# Patient Record
Sex: Female | Born: 1990 | Race: Black or African American | Hispanic: No | Marital: Single
Health system: Midwestern US, Community
[De-identification: ages and names within clinical notes are randomized; demographics above are authoritative.]

## PROBLEM LIST (undated history)

## (undated) DIAGNOSIS — J302 Other seasonal allergic rhinitis: Secondary | ICD-10-CM

## (undated) DIAGNOSIS — M25551 Pain in right hip: Principal | ICD-10-CM

## (undated) DIAGNOSIS — F419 Anxiety disorder, unspecified: Secondary | ICD-10-CM

## (undated) DIAGNOSIS — J45909 Unspecified asthma, uncomplicated: Secondary | ICD-10-CM

## (undated) DIAGNOSIS — F32A Depression, unspecified: Secondary | ICD-10-CM

## (undated) DIAGNOSIS — F329 Major depressive disorder, single episode, unspecified: Secondary | ICD-10-CM

## (undated) DIAGNOSIS — M069 Rheumatoid arthritis, unspecified: Secondary | ICD-10-CM

## (undated) HISTORY — DX: Depression, unspecified: F32.A

## (undated) HISTORY — PX: OTHER SURGICAL HISTORY: SHX169

## (undated) HISTORY — DX: Morbid (severe) obesity due to excess calories: E66.01

## (undated) HISTORY — DX: Anxiety disorder, unspecified: F41.9

## (undated) HISTORY — PX: BREAST REDUCTION SURGERY: SHX8

## (undated) HISTORY — PX: ROOT CANAL: SHX2363

## (undated) HISTORY — DX: Rheumatoid arthritis, unspecified: M06.9

## (undated) HISTORY — DX: Other seasonal allergic rhinitis: J30.2

---
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
## 2009-05-08 ENCOUNTER — Ambulatory Visit (INDEPENDENT_AMBULATORY_CARE_PROVIDER_SITE_OTHER): Payer: MEDICAID | Admitting: UHP WOMENS HEALTH

## 2009-05-08 ENCOUNTER — Other Ambulatory Visit: Payer: Self-pay

## 2009-05-08 ENCOUNTER — Encounter (INDEPENDENT_AMBULATORY_CARE_PROVIDER_SITE_OTHER): Payer: Self-pay | Admitting: UHP WOMENS HEALTH

## 2009-05-08 ENCOUNTER — Ambulatory Visit: Admit: 2009-05-08 | Payer: Self-pay | Source: Ambulatory Visit | Admitting: UHP WOMENS HEALTH

## 2009-05-08 VITALS — BP 120/80 | Wt 145.0 lb

## 2009-05-08 LAB — NEISSERIA GONORRHOEAE DNA BY PCR
SPECIMEN SITE: NEGATIVE — NL
SPECIMEN SITE: NEGATIVE — NL

## 2009-05-08 LAB — CHLAMYDIA TRACHOMITIS DNA BY PCR (INHOUSE)
CHLAMYDIA TRACHOMATIS: POSITIVE — NL
CHLAMYDIA TRACHOMATIS: POSITIVE — NL

## 2009-05-09 ENCOUNTER — Ambulatory Visit: Admission: RE | Admit: 2009-05-09 | Payer: Self-pay | Source: Ambulatory Visit | Admitting: UHP WOMENS HEALTH

## 2009-05-09 LAB — CBC
HCT: 37 — NL
HGB: 12.7 — NL
PLATELET COUNT: 322 — NL
WBC: 11.8 — NL

## 2009-05-09 LAB — CYSTIC FIBROSIS, CARRIER DETECTION: CYSTIC FIBROSIS MUTAT. PNL: NEGATIVE — NL

## 2009-05-09 LAB — HEPATITIS B SURFACE ANTIGEN: HEPATITIS B SURFACE AG: NONREACTIVE — NL

## 2009-05-09 LAB — RUBELLA VIRUS IGG AB
RUBELLA IGG: IMMUNE — NL
RUBELLA IGG: IMMUNE — NL

## 2009-05-09 LAB — SYPHILIS AB SCREEN, WITH REFLEX
RPR: NONREACTIVE — NL
RPR: NONREACTIVE — NL

## 2009-05-09 LAB — ABO/RH AND ANTIBODY SCREEN
ABO/RH(D): A POS — NL
ANTIBODY SCREEN: NEGATIVE — NL

## 2009-05-09 LAB — NEISSERIA GONORRHOEAE DNA BY PCR: SPECIMEN SITE: NEGATIVE — NL

## 2009-05-09 LAB — HCG, PLASMA OR SERUM QUANTITATIVE, PREGNANCY: HCG QUANTITATIVE/PREG: 130671 — NL

## 2009-05-14 ENCOUNTER — Encounter (INDEPENDENT_AMBULATORY_CARE_PROVIDER_SITE_OTHER): Payer: Self-pay | Admitting: UHP WOMENS HEALTH

## 2009-05-15 ENCOUNTER — Ambulatory Visit: Admission: RE | Admit: 2009-05-15 | Payer: Self-pay | Source: Ambulatory Visit | Admitting: UHP WOMENS HEALTH

## 2009-05-16 ENCOUNTER — Encounter (INDEPENDENT_AMBULATORY_CARE_PROVIDER_SITE_OTHER): Payer: Self-pay | Admitting: UHP WOMENS HEALTH

## 2009-05-16 LAB — HISTORICAL CYTOPATHOLOGY-GYN (PAP AND HPV TESTS): Final Diagnosis: UNDETERMINED

## 2009-06-05 ENCOUNTER — Ambulatory Visit (INDEPENDENT_AMBULATORY_CARE_PROVIDER_SITE_OTHER): Payer: MEDICAID | Admitting: UHP WOMENS HEALTH

## 2009-06-05 ENCOUNTER — Encounter (INDEPENDENT_AMBULATORY_CARE_PROVIDER_SITE_OTHER): Payer: Self-pay | Admitting: UHP WOMENS HEALTH

## 2009-06-05 DIAGNOSIS — IMO0001 Reserved for inherently not codable concepts without codable children: Secondary | ICD-10-CM

## 2009-06-05 HISTORY — DX: Reserved for inherently not codable concepts without codable children: IMO0001

## 2009-06-05 NOTE — Progress Notes (Signed)
PATIENT RETURNS FOR OB CHECK. IBW AND ULTRASOUND REPORT REVIEWED WITH PATIENT. IRON STARTED FOR MILD ANEMIA. SHE REPORTS FEELING BETTER. LESS NAUSEA. SHE WAS PLEASED TO HEAR FHT'S. REVIEWED NORMAL CHANGES, NUTRITION NEEDS, AND IMPORTANCE OF AVOIDING STRESS AND EXPOSURE TO HARMFUL SUBSTANCES. SHE STATES THAT SHE IS NO LONGER SMOKING AND TRIES TO AVOID 2ND HAND SMOKE. WILL DISCUSS QUAD SCREEN AND OFFER FLU VACCINE AT [facility] IN 4 WEEKS.

## 2009-06-11 ENCOUNTER — Encounter (INDEPENDENT_AMBULATORY_CARE_PROVIDER_SITE_OTHER): Payer: Self-pay | Admitting: UHP WOMENS HEALTH

## 2009-06-24 ENCOUNTER — Encounter (INDEPENDENT_AMBULATORY_CARE_PROVIDER_SITE_OTHER): Payer: Self-pay | Admitting: UHP WOMENS HEALTH

## 2009-07-03 ENCOUNTER — Ambulatory Visit (INDEPENDENT_AMBULATORY_CARE_PROVIDER_SITE_OTHER): Payer: MEDICAID

## 2009-07-03 ENCOUNTER — Encounter (INDEPENDENT_AMBULATORY_CARE_PROVIDER_SITE_OTHER): Payer: MEDICAID | Admitting: UHP WOMENS HEALTH

## 2009-07-03 NOTE — Progress Notes (Signed)
Pt defers cultures to visit with cnm. No c/o   Declines  Afp tetra  Will order  Anatomic survey , desires flu shot .

## 2009-07-24 ENCOUNTER — Ambulatory Visit (INDEPENDENT_AMBULATORY_CARE_PROVIDER_SITE_OTHER): Payer: MEDICAID | Admitting: Family Medicine

## 2009-07-24 NOTE — Progress Notes (Signed)
18yo G1P0 @ 20+2wks here for [facility] visit. Pt also sts she was to have anatomical survey USG today, but unfortunately was not scheduled appropriate time slot for this procedure. Advised pt that I would be happy to accommodate if possible, but unfortunately cannot do so with other pts waiting today. She was understanding of this. Made arrangements for pt to have USG within the next wk with [place], and she was pleased with this. She is not having any problems currently. Had been having some nausea which has passed. She has started to feel FM and seems excited about this.  Pt to f/u within one wk for anatomical survey/USG. If no office appts available, will make arrangements for her to have procedure at [facility].   Pt verbalized agreement with and understanding of assessment, plan of care, f/u recommendations. All questions answered.      **Also noted (unfortunately after pt left office) that pt had +Chlamydia test in her prenatal panel. Per prior notes, she has been treated. She had deferred TOC when seen by Dr. Dayton Scrape in December. She still needs these done and will need repeat at ~ 36 wks. Since USG scheduled at [facility], recommend she have TOC at her 24 wk visit following USG.

## 2009-08-01 ENCOUNTER — Encounter (INDEPENDENT_AMBULATORY_CARE_PROVIDER_SITE_OTHER): Payer: Self-pay | Admitting: Family Medicine

## 2009-08-01 ENCOUNTER — Ambulatory Visit (INDEPENDENT_AMBULATORY_CARE_PROVIDER_SITE_OTHER): Payer: MEDICAID | Admitting: Family Medicine

## 2009-08-01 VITALS — BP 122/60 | Wt 151.0 lb

## 2009-08-01 NOTE — Procedures (Signed)
Pregnancy Ultrasound Procedure Note    Referring Physician: Referral Self    Technician: Study performed by the interpreting physician    Indications:  uncertain dating, inadequate anatomy    Procedure:  Second Trimester OB Ultrasound, transabdominal    Procedure Details:  Measurements accompanying this report are noted.       Anatomy Visualized    Fetal Heart:    Yes,   Fetal Number:   singleton   Fetal Position:   Vertex  3V Cord:   Yes  AFI Volume:   14.7 cm,adequate  Placenta Grade:  0  Placental Location:  anterior  Previa:    No  Face:    Yes  Spine - Neck:   Yes  Spine - Thoracic:  Yes  Spine - Lumbar:  Yes    4 Chamber Heart:  Yes  Aortic Arch:   Not done  Great Vessels:  Not done  4 Extremities:   Yes  Diaphragm:   Yes  Abdominal Wall:  Yes  Cord Insertion:  Yes  Stomach:   Yes  Bowel Pattern:  Yes   Bladder:   Yes  Genitalia:   Female  Lateral Ventricles:  Yes    Biophysical Profile:     Measurements:   See ultrasound print out.  Cervical Length:   cm  BPD: 54.1 mm, 22 wks 3 days   HC: 209 mm, 23 wks 0 days   AC: 178 mm, 22 wks 5 days   FL: 36.2 mm, 21 wks 3 days   EFW: 489 gm    Findings: Live, singleton intrauterine pregnancy at 21 weeks, 3 days, with a calculated EDC of 12/01/09. Anatomy as noted above.    Cliffton Asters Geoffery Lyons, M.D., F.A.A.F.P  [facility]  [address]  [place], [place] [address]  [phone]  Fax [phone]  [email or web address]

## 2009-08-01 NOTE — Progress Notes (Signed)
I saw and examined the patient and discussed management with the resident. I reviewed the resident's note and agree with the documented findings and plan of care except as noted below:

## 2009-08-01 NOTE — Progress Notes (Signed)
Pt states she is doing well, denies spotting/bleeding/ctxs. Her nausea has improved. States she smokes occasionally, advised her to quit smoking. Pt takes PNV, imitrex PRN, Iron pills. Pt is unaware of fam hx of DM.

## 2009-08-29 ENCOUNTER — Encounter (INDEPENDENT_AMBULATORY_CARE_PROVIDER_SITE_OTHER): Payer: MEDICAID | Admitting: Family Medicine

## 2009-08-29 ENCOUNTER — Ambulatory Visit (INDEPENDENT_AMBULATORY_CARE_PROVIDER_SITE_OTHER): Payer: Medicaid Other | Admitting: Family Medicine

## 2009-09-19 ENCOUNTER — Encounter (INDEPENDENT_AMBULATORY_CARE_PROVIDER_SITE_OTHER): Payer: Medicaid Other | Admitting: Family Medicine

## 2009-09-20 ENCOUNTER — Encounter (INDEPENDENT_AMBULATORY_CARE_PROVIDER_SITE_OTHER): Payer: Medicaid Other | Admitting: UHP WOMENS HEALTH

## 2009-09-20 ENCOUNTER — Ambulatory Visit (INDEPENDENT_AMBULATORY_CARE_PROVIDER_SITE_OTHER): Payer: Medicaid Other

## 2009-09-20 VITALS — BP 108/60 | Wt 164.8 lb

## 2009-09-20 NOTE — Progress Notes (Signed)
No c/o labor precautions given gc chl next 28 week labs ordered

## 2009-10-04 ENCOUNTER — Ambulatory Visit (INDEPENDENT_AMBULATORY_CARE_PROVIDER_SITE_OTHER): Payer: Medicaid Other

## 2009-10-04 VITALS — BP 110/74 | Wt 167.2 lb

## 2009-10-04 NOTE — Progress Notes (Signed)
No c/o,  labor precautions given reorder 28 week labs,  pt lost original

## 2009-10-05 ENCOUNTER — Ambulatory Visit: Admission: RE | Admit: 2009-10-05 | Payer: Self-pay | Source: Ambulatory Visit

## 2009-10-05 LAB — CBC
BASOPHIL #: 0.07 10*3/uL (ref 0.0–0.10)
HCT: 35.5 % — ABNORMAL LOW (ref 36.0–48.0)
HGB: 12.2 g/dL (ref 12.0–16.0)
RBC: 3.87 M/uL — ABNORMAL LOW (ref 4.0–5.6)
WBC: 11.7 K/uL — ABNORMAL HIGH (ref 4.0–11.0)

## 2009-10-05 LAB — ANTIBODY SCREEN: ANTIBODY SCREEN: NEGATIVE

## 2009-10-05 LAB — GLUCOLA, 1HR: ONE HOUR GLUCOLA: 91 mg/dL (ref 70–139)

## 2009-10-17 ENCOUNTER — Ambulatory Visit (INDEPENDENT_AMBULATORY_CARE_PROVIDER_SITE_OTHER): Payer: Medicaid Other | Admitting: Family Medicine

## 2009-10-17 NOTE — Progress Notes (Signed)
Doing well  PTL reviewed.  Pregravid weight revised.  follow up 2 weeks    Plan chlamydia with GBS at 36 weeks (TOC not done, but will now defer to 36 weeks. Other options discussed with patient)

## 2009-10-31 ENCOUNTER — Encounter (INDEPENDENT_AMBULATORY_CARE_PROVIDER_SITE_OTHER): Payer: Medicaid Other | Admitting: Family Medicine

## 2009-11-04 ENCOUNTER — Ambulatory Visit (INDEPENDENT_AMBULATORY_CARE_PROVIDER_SITE_OTHER): Payer: Medicaid Other | Admitting: Family Medicine

## 2009-11-04 NOTE — Progress Notes (Signed)
1. Normal pregnancy, first (V22.0D)   - routine care   2. Chlamydia infection, current pregnancy, treated (647.20H)   - repeat Chlamydia at next visit with GBS       follow up 1 week

## 2009-11-14 ENCOUNTER — Ambulatory Visit (INDEPENDENT_AMBULATORY_CARE_PROVIDER_SITE_OTHER): Payer: Medicaid Other | Admitting: Family Medicine

## 2009-11-14 ENCOUNTER — Ambulatory Visit: Admit: 2009-11-14 | Payer: Self-pay | Source: Ambulatory Visit | Admitting: Family Medicine

## 2009-11-17 LAB — GROUP B STREP - BMC/JMC ONLY

## 2009-11-20 ENCOUNTER — Ambulatory Visit (INDEPENDENT_AMBULATORY_CARE_PROVIDER_SITE_OTHER): Payer: Medicaid Other

## 2009-11-20 NOTE — Progress Notes (Signed)
No c/o gbbs is negative  gc /chl  Deferred to female provider until next visit will order sonogram to r/o iugr

## 2009-11-20 NOTE — Progress Notes (Signed)
Labor precautions

## 2009-11-26 ENCOUNTER — Ambulatory Visit: Admission: RE | Admit: 2009-11-26 | Payer: Self-pay | Source: Ambulatory Visit | Admitting: Family Medicine

## 2009-11-26 LAB — URINALYSIS - JMC ONLY
BILIRUBIN,URINE: NEGATIVE mg/dL
BLOOD,URINE: NEGATIVE
GLUCOSE,URINE: NORMAL mg/dL
KETONE, URINE: NEGATIVE mg/dL
LEUKOCYTE ESTERACE,URINE: NEGATIVE
NITRITES,URINE: NEGATIVE
PH,URINE: 7 (ref 5.0–7.5)
PROTEIN,URINE: NEGATIVE mg/dL
SPECIFIC GRAVITY,URINE: 1.005 (ref 1.005–1.020)
UROBILINOGEN,URINE: NORMAL mg/dL (ref 0.2–1.0)

## 2009-11-28 ENCOUNTER — Encounter (INDEPENDENT_AMBULATORY_CARE_PROVIDER_SITE_OTHER): Payer: Medicaid Other | Admitting: Family Medicine

## 2009-11-28 ENCOUNTER — Ambulatory Visit (INDEPENDENT_AMBULATORY_CARE_PROVIDER_SITE_OTHER): Payer: Medicaid Other | Admitting: Family Medicine

## 2009-11-28 NOTE — Progress Notes (Signed)
Labor reviewed.

## 2009-11-29 ENCOUNTER — Inpatient Hospital Stay
Admit: 2009-11-29 | Discharge: 2009-11-29 | Disposition: A | Discharge status: Home / Self Care | Payer: Self-pay | Source: Ambulatory Visit | Attending: Family Medicine | Admitting: Family Medicine

## 2009-11-29 ENCOUNTER — Ambulatory Visit: Admit: 2009-11-29 | Payer: Self-pay | Source: Ambulatory Visit | Admitting: Family Medicine

## 2009-11-29 LAB — MICROSCOPIC URINE - JMC ONLY
BLOOD,URINE: NEGATIVE
GLUCOSE,URINE: NORMAL mg/dL
KETONE, URINE: NEGATIVE mg/dL
NITRITES,URINE: NEGATIVE
PH,URINE: 6.5 (ref 5.0–7.5)
PROTEIN,URINE: NEGATIVE mg/dL
RBC,URINE: NONE SEEN /hpf (ref 0–3)
SPECIFIC GRAVITY,URINE: 1.02 (ref 1.005–1.020)
UROBILINOGEN,URINE: NORMAL mg/dL (ref 0.2–1.0)

## 2009-12-01 ENCOUNTER — Ambulatory Visit: Admit: 2009-12-01 | Payer: Self-pay | Source: Ambulatory Visit | Admitting: Family Medicine

## 2009-12-01 LAB — URINE CULTURE

## 2009-12-02 ENCOUNTER — Inpatient Hospital Stay
Admission: AD | Admit: 2009-12-02 | Discharge: 2009-12-05 | Disposition: A | Discharge status: Home / Self Care | Payer: Self-pay | Source: Ambulatory Visit | Attending: Family Medicine | Admitting: Family Medicine

## 2009-12-02 ENCOUNTER — Ambulatory Visit: Admit: 2009-12-02 | Payer: Self-pay | Source: Ambulatory Visit | Admitting: Family Medicine

## 2009-12-02 LAB — CBC
BASOPHIL #: 0.1 10*3/uL (ref 0.0–0.10)
BASOPHILS %: 0.6 % (ref 0–2.50)
EOSINOPHIL #: 0.15 10*3/uL (ref 0.00–0.50)
EOSINOPHIL %: 0.9 % (ref 0.0–5.2)
HCT: 40.3 % (ref 36.0–48.0)
HGB: 14 g/dL (ref 12.0–16.0)
LYMPHOCYTE #: 2.74 10*3/uL (ref 0.7–3.20)
LYMPHOCYTE %: 16.7 % (ref 15.0–43.0)
MCH: 30.5 pg (ref 28.3–34.3)
MCHC: 34.7 g/dL (ref 32.0–36.0)
MCV: 87.9 fL — AB (ref 82.0–100.0)
MONOCYTE #: 1.41 10*3/uL — ABNORMAL HIGH (ref 0.20–0.90)
MONOCYTE %: 8.5 % (ref 4.8–12.0)
MPV: 8 fL (ref 7.4–10.45)
PLATELET COUNT: 312 10*3/uL — AB (ref 150–400)
PMN #: 12.07 10*3/uL — ABNORMAL HIGH (ref 1.5–6.5)
PMN %: 73.3 % (ref 43.0–76.0)
RBC: 4.58 M/uL (ref 4.0–5.6)
RDW: 11.5 % (ref 11.0–16.0)
WBC: 16.5 10*3/uL — ABNORMAL HIGH (ref 4.0–11.0)

## 2009-12-02 LAB — TYPE AND SCREEN
ABO/RH(D): A POS
ANTIBODY SCREEN: NEGATIVE

## 2009-12-04 LAB — CBC
HCT: 36.2 % (ref 36.0–48.0)
HGB: 12.6 g/dL (ref 12.0–16.0)
MCH: 31 pg (ref 28.3–34.3)
MCHC: 34.8 g/dL (ref 32.0–36.0)
MCV: 89.1 fL (ref 82.0–100.0)
MPV: 8 fL (ref 7.4–10.45)
PLATELET COUNT: 231 K/uL — AB (ref 150–400)
RBC: 4.06 M/uL (ref 4.0–5.6)
RDW: 11.7 % (ref 11.0–16.0)
WBC: 18.5 10*3/uL — ABNORMAL HIGH (ref 4.0–11.0)

## 2009-12-04 LAB — MANUAL DIFFERENTIAL - BMC/JMC ONLY
BANDS: 1 % (ref 0–4)
EOSINOPHIL: 2 % (ref 0.0–5.2)
LYMPHOCYTES: 24 % (ref 15.0–43.0)
MONOCYTES: 7 % (ref 4.8–12.0)
PLATELET ESTIMATE: ADEQUATE
PMN'S: 66 % (ref 43.0–76.0)

## 2009-12-05 ENCOUNTER — Encounter (INDEPENDENT_AMBULATORY_CARE_PROVIDER_SITE_OTHER): Payer: Medicaid Other | Admitting: Family Medicine

## 2010-01-28 ENCOUNTER — Telehealth (INDEPENDENT_AMBULATORY_CARE_PROVIDER_SITE_OTHER): Payer: Self-pay | Admitting: Family Medicine

## 2010-01-28 NOTE — Telephone Encounter (Signed)
I don't see any record of my sending rx for inhaler in EMR. Not sure if she needed it before EMR was implemented, or if it was an rx given to her when she was d/c'd from the hospital. Either way, it appears I haven't seen her since January in EMR and she should be reevaluated.

## 2010-01-28 NOTE — Telephone Encounter (Signed)
Patient left a message on the prescription line stating that you prescribed her an inhaler after she had asthma exacerbation during the delivery of her daughter.  She is requesting another prescription for this inhaler to be sent to [facility].  Please advise.

## 2010-01-29 NOTE — Telephone Encounter (Signed)
Appointment made for 02/03/10.

## 2010-02-03 ENCOUNTER — Encounter (INDEPENDENT_AMBULATORY_CARE_PROVIDER_SITE_OTHER): Payer: Medicaid Other | Admitting: Family Medicine

## 2010-12-18 ENCOUNTER — Emergency Department: Admit: 2010-12-18 | Discharge: 2010-12-18 | Disposition: A | Discharge status: Home / Self Care | Payer: Self-pay

## 2011-05-19 ENCOUNTER — Emergency Department
Admission: EM | Admit: 2011-05-19 | Discharge: 2011-05-19 | Disposition: A | Discharge status: Home / Self Care | Payer: Self-pay | Attending: Emergency Medicine | Admitting: Emergency Medicine

## 2011-05-19 DIAGNOSIS — L03319 Cellulitis of trunk, unspecified: Secondary | ICD-10-CM | POA: Insufficient documentation

## 2011-05-19 MED ORDER — SULFAMETHOXAZOLE 800 MG-TRIMETHOPRIM 160 MG TABLET
1.00 | ORAL_TABLET | Freq: Once | ORAL | Status: AC
Start: 2011-05-19 — End: 2011-05-19
  Administered 2011-05-19: 160 mg via ORAL

## 2011-05-19 MED ORDER — IBUPROFEN 600 MG TABLET
600.00 mg | ORAL_TABLET | Freq: Once | ORAL | Status: AC
Start: 2011-05-19 — End: 2011-05-19

## 2011-05-19 MED ORDER — SULFAMETHOXAZOLE 800 MG-TRIMETHOPRIM 160 MG TABLET
1.00 | ORAL_TABLET | Freq: Two times a day (BID) | ORAL | Status: AC
Start: 2011-05-19 — End: 2011-05-29

## 2011-05-19 MED ORDER — IBUPROFEN 600 MG TABLET
ORAL_TABLET | ORAL | Status: AC
Start: 2011-05-19 — End: 2011-05-19
  Administered 2011-05-19: 600 mg via ORAL
  Filled 2011-05-19: qty 1

## 2011-05-19 MED ORDER — CEPHALEXIN 500 MG CAPSULE
500.0000 mg | ORAL_CAPSULE | Freq: Once | ORAL | Status: AC
Start: 2011-05-19 — End: 2011-05-19

## 2011-05-19 MED ORDER — CEPHALEXIN 500 MG CAPSULE
500.00 mg | ORAL_CAPSULE | Freq: Two times a day (BID) | ORAL | Status: DC
Start: 2011-05-19 — End: 2011-11-27

## 2011-05-19 MED ORDER — CEPHALEXIN 250 MG/5 ML ORAL SUSPENSION
500.00 mg | INHALATION_SUSPENSION | Freq: Once | ORAL | Status: DC
Start: 2011-05-19 — End: 2011-05-19

## 2011-05-19 MED ORDER — SULFAMETHOXAZOLE 800 MG-TRIMETHOPRIM 160 MG TABLET
ORAL_TABLET | ORAL | Status: DC
Start: 2011-05-19 — End: 2011-05-19
  Filled 2011-05-19: qty 1

## 2011-05-19 MED ORDER — CEPHALEXIN 500 MG CAPSULE
ORAL_CAPSULE | ORAL | Status: AC
Start: 2011-05-19 — End: 2011-05-19
  Administered 2011-05-19: 500 mg via ORAL
  Filled 2011-05-19: qty 1

## 2011-05-19 NOTE — ED Provider Notes (Signed)
HPI Comments: This is a 20 yo female who presents with infected hair follicle to suprapubic region.  Pt states she gets waxed, and noticed that she had a red swollen area to suprapubic region.  Denies fevers. Denies drainage. Denies vaginal discharge or any vaginal symptoms.  Denies urinary symptoms.    The history is provided by the patient.           Review of Systems   Constitutional: Negative.  Negative for fever and chills.   HENT: Negative.    Eyes: Negative.    Respiratory: Negative.    Cardiovascular: Negative.    Gastrointestinal: Negative.    Genitourinary: Negative.  Negative for vaginal discharge, difficulty urinating and pelvic pain.   Musculoskeletal: Negative.    Skin: Negative.    Neurological: Negative.    Hematological: Negative.    Psychiatric/Behavioral: Negative.    [all other systems reviewed and are negative            History:   Past Medical History:  Past Medical History   Diagnosis Date   . ASCUS on Pap smear for follow-up postpartum 06/05/2009       Past Surgical History:  Past Surgical History   Procedure Date   . Hx fracture tx 2009   . Hx fracture tx        Social History:  History   Substance Use Topics   . Smoking status: Current Everyday Smoker -- 0.5 packs/day for 4 years   . Smokeless tobacco: Not on file    Comment: quit when she found out she was pregnant   . Alcohol Use: No     History   Drug Use No       Family History:  Family History   Problem Relation Age of Onset   . Adopted: Yes         Physical Exam   [nursing notereviewed.  Constitutional: She is oriented to person, place, and time. She appears well-developed and well-nourished. She appears distressed.   HENT:   Head: Normocephalic and atraumatic.   Eyes: Pupils are equal, round, and reactive to light.   Abdominal: Soft. Bowel sounds are normal.   Lymphadenopathy:        Right: No inguinal adenopathy present.        Left: No inguinal adenopathy present.    Neurological: She is alert and oriented to person, place, and time.   Skin: Skin is warm and dry.             Erythematous, fluctuant 1 cm circular area to suprapubic region.   Psychiatric: She has a normal mood and affect. Her behavior is normal.       Encounter Date: 05/19/2011     Emergency Department Procedure:    I&D Abscess  Pre-Procedure Documentation: Sterile technique was used  Skin was prepped with: Betadine  Description:: Single simple abscess  Fluid was grossly: Purulent  Incision with: small puncture made with #18 guage needle.  purulent fluid expressed.  culture obtained and sent.  Ultrasound Guidance Required?: No        ED Course:    Results:    ED Course:  Will place small puncture to center of lesion to see if any purulent drainage noted.    Evaluation / Plan:  Dx:  Folliculitis / abscess  Rx:  Bactrim / Keflex  Culture obtained and sent          Dx:  Folliculitis / abscess

## 2011-05-19 NOTE — ED Nurses Note (Signed)
Red, raised swollen infected hair to top part of vagina x 2 days, denies drainage from area.

## 2011-05-22 LAB — WOUND/ABSCESS CULT-AEROBIC - BMC/JMC ONLY
CIPROFLOXACIN: >=8 — AB
CLINDAMYCIN: <=0.25 — AB
ERYTHROMYCIN: <=0.25 — AB
GENTAMICIN: <=0.5 — AB
INDUCIBLE CLINDAMYCIN RESIST: NEGATIVE
LEVOFLOXACIN: 4 — AB
LINEZOLID: 2 — AB
MOXIFLOXACIN: 1 — AB
OXACILLIN: 0.5 — AB
PENICILLIN: >=0.5 — AB
RIFAMPIN: <=0.5 — AB
TETRACYCLINE: <=1 — AB
TRIMETHOPRIM/SULFAMETHOXAZOLE: <=10 — AB
VANCOMYCIN: 1 — AB

## 2011-07-20 ENCOUNTER — Encounter (INDEPENDENT_AMBULATORY_CARE_PROVIDER_SITE_OTHER): Payer: Self-pay | Admitting: Family Medicine

## 2011-07-29 LAB — RUBELLA VIRUS IGG AB: RUBELLA IGG: POSITIVE

## 2011-08-03 ENCOUNTER — Encounter (INDEPENDENT_AMBULATORY_CARE_PROVIDER_SITE_OTHER): Payer: Self-pay | Admitting: UHP WOMENS HEALTH

## 2011-08-26 ENCOUNTER — Other Ambulatory Visit (HOSPITAL_BASED_OUTPATIENT_CLINIC_OR_DEPARTMENT_OTHER)
Admission: RE | Admit: 2011-08-26 | Discharge: 2011-08-26 | Disposition: A | Discharge status: Home / Self Care | Payer: MEDICAID

## 2011-08-26 DIAGNOSIS — Z8614 Personal history of Methicillin resistant Staphylococcus aureus infection: Secondary | ICD-10-CM | POA: Insufficient documentation

## 2011-08-28 LAB — KLEBSIELLA MDRO SURVEILANCE CULTURE- BMC/JMC ONLY

## 2011-09-23 ENCOUNTER — Other Ambulatory Visit (HOSPITAL_BASED_OUTPATIENT_CLINIC_OR_DEPARTMENT_OTHER)
Admission: RE | Admit: 2011-09-23 | Discharge: 2011-09-23 | Disposition: A | Discharge status: Home / Self Care | Payer: MEDICAID

## 2011-09-23 DIAGNOSIS — Z8614 Personal history of Methicillin resistant Staphylococcus aureus infection: Secondary | ICD-10-CM | POA: Insufficient documentation

## 2011-09-25 LAB — KLEBSIELLA MDRO SURVEILANCE CULTURE- BMC/JMC ONLY

## 2011-10-30 ENCOUNTER — Other Ambulatory Visit (HOSPITAL_BASED_OUTPATIENT_CLINIC_OR_DEPARTMENT_OTHER)
Admission: RE | Admit: 2011-10-30 | Discharge: 2011-10-30 | Disposition: A | Discharge status: Home / Self Care | Payer: Medicaid Other

## 2011-10-30 DIAGNOSIS — Z8614 Personal history of Methicillin resistant Staphylococcus aureus infection: Secondary | ICD-10-CM | POA: Insufficient documentation

## 2011-11-01 LAB — KLEBSIELLA MDRO SURVEILANCE CULTURE- BMC/JMC ONLY

## 2011-11-27 ENCOUNTER — Ambulatory Visit (HOSPITAL_BASED_OUTPATIENT_CLINIC_OR_DEPARTMENT_OTHER): Payer: Medicaid Other

## 2011-11-27 ENCOUNTER — Ambulatory Visit (HOSPITAL_BASED_OUTPATIENT_CLINIC_OR_DEPARTMENT_OTHER)
Admission: RE | Admit: 2011-11-27 | Discharge: 2011-11-27 | Disposition: A | Discharge status: Home / Self Care | Payer: Medicaid Other | Source: Ambulatory Visit

## 2011-11-27 ENCOUNTER — Encounter (HOSPITAL_BASED_OUTPATIENT_CLINIC_OR_DEPARTMENT_OTHER): Payer: Self-pay

## 2011-11-27 DIAGNOSIS — O368190 Decreased fetal movements, unspecified trimester, not applicable or unspecified: Secondary | ICD-10-CM | POA: Insufficient documentation

## 2011-11-27 LAB — DRUG SCREEN,URINE - BMC/JMC ONLY
AMPHETAMINE: NEGATIVE ng/mL
BARBITURATES: NEGATIVE ng/mL
BENZODIAZEPINES: NEGATIVE ng/mL
COCAINE: NEGATIVE ng/mL
MARIJUANA: NEGATIVE ng/mL
METHADONE: NEGATIVE ng/mL
OPIATES: NEGATIVE ng/mL
TRICYCLIC SCREEN: NEGATIVE ng/mL

## 2011-11-27 NOTE — Nurses Notes (Signed)
Pt arrived to unit ambulatory under services of L Wyatt CNM at [redacted] weeks gestation with c/o decreased fetal movement. Pt states "she is moving just not as much".  Pt to room 725, to bed with Toco and [place] on for EFM.  Plan of care discussed.  Pt denies LOF/VB/pain.

## 2011-11-27 NOTE — Nurses Notes (Signed)
CNM notified of pts arrival and status

## 2011-11-27 NOTE — Nurses Notes (Signed)
Ultrasound results given to CNM

## 2011-11-27 NOTE — Nurses Notes (Signed)
Pt returned from ultrasound.  EFM resumed

## 2011-11-27 NOTE — Progress Notes (Signed)
 BPP today was 8/8, EFM strip Category I.  Explained to pt the reasons for occasional benign decreased movement.  Also advised her to pay attention to baby's movements and if she feels movements are absent, eat a meal and drink a glass of water, sit quietly with TV and phone off.  If no movement for 1 hour after a meal call   Us  right away.  Pt reports understanding.  Discharge to home.

## 2011-11-27 NOTE — Nurses Notes (Signed)
EFm removed.  Pt to ultrasound via wheelchair.

## 2011-11-27 NOTE — Nurses Notes (Signed)
CNM at bedside discussing plan of care.

## 2011-11-27 NOTE — Nurses Notes (Signed)
Pt up to BR

## 2011-11-27 NOTE — Nurses Notes (Signed)
Pt left unit ambulatory for home with copy of instructions

## 2011-11-27 NOTE — Nurses Notes (Signed)
CNM in to discuss ultrasound report with pt. Discharge order obtained.  EFM removed.  Instructions reviewed with pt. Pt verbalized understanding.

## 2011-11-27 NOTE — Progress Notes (Signed)
[facility]  [place], [place] [address]    L&D Progress Note      [name] [name],[name] [name], 21 y.o. female  Date of Admission:  11/27/2011  Date of Service: 11/27/2011  Date of Birth:  March 20, 1991    Hospital Day:  LOS: 0 days     Subjective: Pt presented to the office today with c/o decreased fetal movement.  She reported that she regularly goes for a day or two without feeling movement.    Objective:   NST reactive already and pt reports feeling movement, movement audible through EFM.    Vital Signs:  Temp (24hrs) Max:36.9 C (98.4 F)    Temperature: 36.9 C (98.4 F) (11/27/11 1538)  BP (Non-Invasive): 124/79 mmHg (11/27/11 1538)  Heart Rate: 101  (11/27/11 1538)  Respiratory Rate: 20  (11/27/11 1538)  Pain Score: 0 (11/27/11 1539)    I/O:  I/O last 24 hours:  No intake or output data in the 24 hours ending 11/27/11 1600  I/O current shift:       Labs  (Please indicate ordered or reviewed)  Reviewed: I have reviewed all lab results.  Ordered:      Radiology Tests (Please indicate ordered or reviewed)  Reviewed: N/A  Ordered:  BPP with examination of placenta.    Fetal Data:     Multiple Birth: No    Presentation: Unknown     FHR Monitoring Category: Category I    Fetal heart rate tracing:      Heart Rate: 150  Accelerations: Present  Decelerations: None  Variability: Moderate    Estimated Fetal Weight: 1 lb    Contractions:    Mon[place]video Units: n/a  Frequency Range: none recorded  Pitocin (oxytocin) Dose (milliunits/min): n/a    Cervix:     Time of Exam: n/a      Bishop's Scoring System   0 1 2 3    Dilatation 0 1 - 2 3 - 4 5 or more   Effacement 0 - 30 40 - 50 60 - 70 80 or more   Station -3 -2 -1,0 +1,+2   Position Posterior Mid Anterior    Consistency Firm Medium Soft        Assessment/ IUP  At 27 weeks with c/o decreased fetal movement, reactive Category I NST, pt now feeling movement.      Plan:  BPP  Ordered, will re-evaluate when results in.

## 2012-01-27 ENCOUNTER — Emergency Department (EMERGENCY_DEPARTMENT_HOSPITAL): Payer: Medicaid Other

## 2012-01-27 ENCOUNTER — Emergency Department
Admission: EM | Admit: 2012-01-27 | Discharge: 2012-01-27 | Disposition: A | Discharge status: Home / Self Care | Payer: Medicaid Other | Attending: EMERGENCY MEDICINE | Admitting: EMERGENCY MEDICINE

## 2012-01-27 DIAGNOSIS — Z87891 Personal history of nicotine dependence: Secondary | ICD-10-CM | POA: Insufficient documentation

## 2012-01-27 DIAGNOSIS — O99891 Other specified diseases and conditions complicating pregnancy: Secondary | ICD-10-CM | POA: Insufficient documentation

## 2012-01-27 DIAGNOSIS — R42 Dizziness and giddiness: Secondary | ICD-10-CM | POA: Insufficient documentation

## 2012-01-27 DIAGNOSIS — R079 Chest pain, unspecified: Secondary | ICD-10-CM | POA: Insufficient documentation

## 2012-01-27 DIAGNOSIS — R0602 Shortness of breath: Secondary | ICD-10-CM | POA: Insufficient documentation

## 2012-01-27 LAB — CBC
BASOPHIL #: 0.1 K/uL (ref 0.0–0.10)
BASOPHILS %: 0.7 % (ref 0–2.50)
EOSINOPHIL #: 0.2 K/uL (ref 0.00–0.50)
EOSINOPHIL %: 1.5 % (ref 0.0–5.2)
HCT: 36.7 % (ref 36.0–48.0)
HGB: 12 g/dL (ref 12.0–16.0)
LYMPHOCYTE #: 2 K/uL (ref 0.7–3.20)
LYMPHOCYTE %: 18.8 % (ref 15.0–43.0)
MCH: 27.6 pg — ABNORMAL LOW (ref 28.3–34.3)
MCHC: 32.7 g/dL (ref 32.0–36.0)
MCV: 84.4 fL — AB (ref 82.0–100.0)
MONOCYTE #: 0.8 10*3/uL (ref 0.20–0.90)
MONOCYTE %: 7.9 % (ref 4.8–12.0)
MPV: 8.5 fL (ref 7.4–10.45)
NRBC ABSOLUTE: 0 10*3/uL
NRBC: 0 /100 WBC (ref 0–0)
PLATELET COUNT: 249 K/uL (ref 150–400)
PMN %: 71.1 % (ref 43.0–76.0)
RBC: 4.35 M/uL (ref 4.0–5.6)
RDW: 13.6 % (ref 11.0–16.0)
WBC: 10.7 K/uL (ref 4.0–11.0)

## 2012-01-27 LAB — CREATINE KINASE (CK), TOTAL, SERUM: CREATINE KINASE (CK): 71 IU/L (ref 38–234)

## 2012-01-27 LAB — B-TYPE NATRIURETIC PEPTIDE (BNP),PLASMA: B-TYPE NATRIURETIC PEPTIDE: 35 pg/mL (ref 0–99)

## 2012-01-27 LAB — COMPREHENSIVE METABOLIC PROFILE - BMC/JMC ONLY
ALBUMIN/GLOBULIN RATIO: 0.8
ALBUMIN: 2.7 g/dL — ABNORMAL LOW (ref 3.5–5.0)
ALKALINE PHOSPHATASE: 118 IU/L (ref 38–126)
ANION GAP: 7 mmol/L (ref 3–11)
AST (SGOT): 19 IU/L (ref 15–41)
BILIRUBIN, TOTAL: 0.5 mg/dL (ref 0.3–1.2)
BUN: 5 mg/dL — ABNORMAL LOW (ref 6–20)
CALCIUM: 8.6 mg/dL (ref 8.6–10.3)
CARBON DIOXIDE: 23 mmol/L (ref 22–32)
CHLORIDE: 107 mmol/L (ref 101–111)
CREATININE: 0.56 mg/dL (ref 0.44–1.00)
ESTIMATED GLOMERULAR FILTRATION RATE: >60 mL/min (ref >60)
GLUCOSE: 80 mg/dL (ref 70–110)
POTASSIUM: 3.5 mmol/L (ref 3.4–5.1)
SODIUM: 137 mmol/L (ref 136–145)
TOTAL PROTEIN: 5.8 g/dL — ABNORMAL LOW (ref 6.4–8.3)

## 2012-01-27 LAB — TROPONIN-I: TROPONIN-I: <0.01 ng/mL (ref 0.00–0.03)

## 2012-01-27 LAB — CREATINE KINASE (CK), MB FRACTION, SERUM: CK-MB: 2 ng/mL (ref 0.6–6.3)

## 2012-01-27 MED ORDER — ALBUTEROL SULFATE CONCENTRATE 2.5 MG/0.5 ML SOLUTION FOR NEBULIZATION
2.5000 mg | INHALATION_SOLUTION | Freq: Once | RESPIRATORY_TRACT | Status: AC
Start: 2012-01-27 — End: 2012-01-27

## 2012-01-27 MED ORDER — ACETAMINOPHEN 325 MG TABLET
650.00 mg | ORAL_TABLET | ORAL | Status: AC
Start: 2012-01-27 — End: 2012-01-27

## 2012-01-27 MED ORDER — IPRATROPIUM BROMIDE 0.02 % SOLUTION FOR INHALATION
RESPIRATORY_TRACT | Status: AC
Start: 2012-01-27 — End: 2012-01-27
  Administered 2012-01-27: 0.5 mg via RESPIRATORY_TRACT
  Filled 2012-01-27: qty 1

## 2012-01-27 MED ORDER — ACETAMINOPHEN 325 MG TABLET
ORAL_TABLET | ORAL | Status: AC
Start: 2012-01-27 — End: 2012-01-27
  Administered 2012-01-27: 650 mg via ORAL
  Filled 2012-01-27: qty 2

## 2012-01-27 MED ORDER — SODIUM CHLORIDE 0.9 % (FLUSH) INJECTION SYRINGE
INJECTION | INTRAMUSCULAR | Status: AC
Start: 2012-01-27 — End: 2012-01-27
  Administered 2012-01-27: 2.5 mL via INTRAVENOUS
  Filled 2012-01-27: qty 20

## 2012-01-27 MED ORDER — IOVERSOL 350 MG IODINE/ML INTRAVENOUS SOLUTION
100.00 mL | INTRAVENOUS | Status: AC
Start: 2012-01-27 — End: 2012-01-27
  Administered 2012-01-27: 100 mL via INTRAVENOUS

## 2012-01-27 MED ORDER — IPRATROPIUM BROMIDE 0.02 % SOLUTION FOR INHALATION
0.5000 mg | Freq: Once | RESPIRATORY_TRACT | Status: AC
Start: 2012-01-27 — End: 2012-01-27

## 2012-01-27 MED ORDER — SODIUM CHLORIDE 0.9 % (FLUSH) INJECTION SYRINGE
2.50 mL | INJECTION | Freq: Three times a day (TID) | INTRAMUSCULAR | Status: DC
Start: 2012-01-27 — End: 2012-01-27

## 2012-01-27 MED ORDER — ALBUTEROL SULFATE CONCENTRATE 2.5 MG/0.5 ML SOLUTION FOR NEBULIZATION
INHALATION_SOLUTION | RESPIRATORY_TRACT | Status: AC
Start: 2012-01-27 — End: 2012-01-27
  Administered 2012-01-27: 2.5 mg via RESPIRATORY_TRACT
  Filled 2012-01-27: qty 1

## 2012-01-27 NOTE — ED Provider Notes (Signed)
Patient is a 21 y.o. female presenting with chest pain.   Chest Pain   Associated symptoms include shortness of breath. Pertinent negatives include no fever.   21 y/o female presents for evaluation of left chest pain/sob x1d, worse today, no fever, patient is [redacted] weeks pregnant        Review of Systems   Constitutional: Negative for fever.   Respiratory: Positive for shortness of breath.    Cardiovascular: Positive for chest pain.   All other systems reviewed and are negative.            History:   Past Medical History:  Past Medical History   Diagnosis Date   . ASCUS on Pap smear for follow-up postpartum 06/05/2009       Past Surgical History:  Past Surgical History   Procedure Date   . Hx fracture tx 2009   . Hx fracture tx        Social History:  History   Substance Use Topics   . Smoking status: Former Smoker -- 0.5 packs/day for 4 years   . Smokeless tobacco: Former Neurosurgeon     Quit date: 05/30/2011    Comment: quit when she found out she was pregnant   . Alcohol Use: No     History   Drug Use No       Family History:  Family History   Problem Relation Age of Onset   . Adopted: Yes           Physical Exam    Note: Nursing note and vital signs reviewed  Constitutional: The patient appears well-developed and well-nourished.in mild distress. They appear nontoxic and well hydrated.  HEENT: Normocephalic and atraumatic.Normal nasal exam. No discharge The oropharynx is clear and moist. There is no exudate.  Eyes: The conjunctivae and EOMs are normal. The pupils are equal, round, and reactive to light.   Neck: There is no significant tenderness or masses. The neck is supple. There are no meningismus signs.  Cardiovascular: The cardiac rate and rhythm are normal, There are no significant murmurs, rubs, or gallops.The peripheral pulses are normal. There is no evidence of DVT.   Pulmonary/Chest: There is no evidence of respiratory distress. The respiratory rate is normal. The breath sounds are equal bilaterally.There are no wheezes, rales, or rhonchi.. There is pleuritic left lower anterior chest pain.  Abdominal: The abdomen is soft and non distended. There is no palpable organomegaly or masses. No pulsatile masses are noted. There is no local tenderness, rebound, or guarding.   Musculoskeletal:The exam of the extremities is normal. There is no significant cervical or thoracolumbar spine findings. There are no deformities. Full ROM of the extremities is present. There is no peripheral edema.   Neurological: The patient is alert and appropriate. The CN II-XII are normal. The motor exam is normal in all extremities. The sensory exam is normal. The are no coordination abnormalities.  Skin: The skin is clear with no apparent rash. Turgor normal.   Psychiatric: Normal demeanor and interpersonal reaction. No psychotic behaviors.        Course    EKG: SR@92 , no ST/T-wave changes  CT Chest: No PE  Labs Reviewed   CBC - Abnormal; Notable for the following:     MCV 84.4 (*)  Delta: 89.1 on 12/04/09-0741    MCH 27.6 (*)      PMN # 7.60 (*)      All other components within normal limits    Narrative:  [facility]S IN ER         COMPREHENSIVE METABOLIC PROFILE - [facility] ONLY - Abnormal; Notable for the following:     BUN 5 (*)      TOTAL PROTEIN 5.8 (*)      ALBUMIN 2.7 (*)      ALT (SGPT) 13 (*)      All other components within normal limits    Narrative:     SSS IN ER   TROPONIN-I    Narrative:     SSS IN ER   CREATINE KINASE (CK) MB ISOENZYME    Narrative:     SSS   CREATINE KINASE (CK), TOTAL    Narrative:     SSS   B-TYPE NATRIURETIC PEPTIDE      1415:  Patient stable on room air, same symptoms, no improvement with meds in ED.  Disccussed case with Dr. Dareen Piano, (cardiology on call), agreed that if patient not in CHF, Dissecting, or PE, safe for discharge. Discussed with patient and mother at bedside, agreed with plan, no pregnancy related complaints at this time, FHT 150s.    Dx: Chest Pain, SOB, Pregnant

## 2012-01-27 NOTE — ED Nurses Note (Signed)
Pt sitting in bed c siderails up x1 and call light at side. Mom at bedside. Pt slightly anxious and tearful intermittently. States good + FM. Waiting for CT to call for cat scan. Medicated for pain c tylenol. No needs at this time.

## 2012-01-27 NOTE — ED Nurses Note (Signed)
 Pt sitting up in bed, family members at bedsides, denies any needs at this time

## 2012-01-27 NOTE — ED Nurses Note (Signed)
Pt states she began having chest pain and difficulty breathing last pm, but states it has progressively gotten worse into this am.  States she is also experiencing lightheadedness.  Pt is [redacted] weeks pregnant.

## 2012-01-27 NOTE — ED Nurses Note (Signed)
Patient discharged home with family.  AVS reviewed with patient/care giver.  A written copy of the AVS and discharge instructions was given to the patient/care giver.  Questions sufficiently answered as needed.  Patient/care giver encouraged to follow up with PCP as indicated.  In the event of an emergency, patient/care giver instructed to call 911 or go to the nearest emergency room.

## 2012-01-27 NOTE — ED Nurses Note (Signed)
 Pt requested to use restroom, ambulated to and from restroom without complaints of dizziness or trouble breathing

## 2012-01-27 NOTE — ED Nurses Note (Signed)
Back from CT; NAD; siderails up and call light in reach. States no pain at this time; no s/s of sob.

## 2012-02-03 ENCOUNTER — Other Ambulatory Visit (HOSPITAL_BASED_OUTPATIENT_CLINIC_OR_DEPARTMENT_OTHER)
Admission: RE | Admit: 2012-02-03 | Discharge: 2012-02-03 | Disposition: A | Discharge status: Home / Self Care | Payer: Medicaid Other

## 2012-02-03 DIAGNOSIS — Z3480 Encounter for supervision of other normal pregnancy, unspecified trimester: Secondary | ICD-10-CM | POA: Insufficient documentation

## 2012-02-05 ENCOUNTER — Encounter (HOSPITAL_BASED_OUTPATIENT_CLINIC_OR_DEPARTMENT_OTHER): Payer: Self-pay

## 2012-02-05 ENCOUNTER — Ambulatory Visit (HOSPITAL_BASED_OUTPATIENT_CLINIC_OR_DEPARTMENT_OTHER)
Admission: RE | Admit: 2012-02-05 | Discharge: 2012-02-05 | Disposition: A | Discharge status: Home / Self Care | Payer: Medicaid Other | Source: Ambulatory Visit

## 2012-02-05 DIAGNOSIS — O368190 Decreased fetal movements, unspecified trimester, not applicable or unspecified: Secondary | ICD-10-CM | POA: Insufficient documentation

## 2012-02-05 LAB — URINALYSIS WITH CULTURE REFLEX IF INDICATED BMC/JMC ONLY
BILIRUBIN,URINE: NEGATIVE mg/dL
BLOOD,URINE: NEGATIVE mg/dL
GLUCOSE, URINE: NEGATIVE mg/dL
KETONES,URINE: NEGATIVE mg/dL
NITRITES,URINE: NEGATIVE
PH,URINE: 6 (ref <8.0)
PROTEIN, URINE, RANDOM: 30 mg/dL — AB
SPECIFIC GRAVITY,URINE: 1.023 — AB (ref <1.022)
UROBILINOGEN, URINE: <2 mg/dL (ref <2.0)

## 2012-02-05 NOTE — Nurses Notes (Signed)
Urine results given to CNM

## 2012-02-05 NOTE — Progress Notes (Signed)
NST Category I with no contractions.  UA indicates possible UTI,.  Will treat with Macrobid and DC to home.

## 2012-02-05 NOTE — Nurses Notes (Signed)
Pt given discharge instructions and discharged to home.

## 2012-02-05 NOTE — Nurses Notes (Signed)
One ctx noted thus far.

## 2012-02-05 NOTE — Progress Notes (Signed)
 [facility]  [place], [place] [address]    L&D Progress Note      [name] [name],[name] [name], 21 y.o. female  Date of Admission:  02/05/2012  Date of Service: 02/05/2012  Date of Birth:  [birth year]    Hospital Day:  LOS: 0 days     Subjective: Pt states she has been having contractions for a few days.  She presents with c/o no fetal movement.  EFM strip Category I with audible fetal movements prior to accels.  When these movements occur pt reports she does not feel them.    Objective:     Vital Signs:  Temp (24hrs) Max:36.7 C (98.1 F)    Temperature: 36.7 C (98.1 F) (02/05/12 1256)  BP (Non-Invasive): 130/76 mmHg (02/05/12 1256)  Heart Rate: 108  (02/05/12 1256)  Respiratory Rate: 20  (02/05/12 1256)  Pain Score (Numeric, Faces): 7 (02/05/12 1257)    I/O:  I/O last 24 hours:  No intake or output data in the 24 hours ending 02/05/12 1341  I/O current shift:       Labs  (Please indicate ordered or reviewed)  Reviewed: I have reviewed all lab results.  Ordered:      Radiology Tests (Please indicate ordered or reviewed)  Reviewed: N/A  Ordered:      Fetal Data:     Multiple Birth: No    Presentation: Vertex     FHR Monitoring Category: Category I    Fetal heart rate tracing:      Heart Rate: 140  Accelerations: Present  Decelerations: None  Variability: Moderate    Estimated Fetal Weight: 7.5 - 8 lbs.    Contractions:    [place] Units: n/a  Frequency Range: n/a      Cervix:       Bishop's Scoring System   0 1 2 3    Dilatation 0 1 - 2 3 - 4 5 or more   Effacement 0 - 30 40 - 50 60 - 70 80 or more   Station -3 -2 -1,0 +1,+2   Position Posterior Mid Anterior    Consistency Firm Medium Soft          Assessment/ Plan:  Active Hospital Problems    Diagnosis   . Decreased fetal movement   . Normal pregnancy, repeat       NST, UA.

## 2012-02-05 NOTE — Nurses Notes (Signed)
CNM in to see pt and review strip

## 2012-02-05 NOTE — Nurses Notes (Signed)
Pt here for c/o ctx's since Wed every 3-4 min apart. States no fetal movement felt since yesterday am. Urine obtained and is tea colored. Enc to increase po fluids. No ctx's noted thus far. Will observe. CNm notified.

## 2012-02-11 ENCOUNTER — Ambulatory Visit (HOSPITAL_BASED_OUTPATIENT_CLINIC_OR_DEPARTMENT_OTHER)
Admission: RE | Admit: 2012-02-11 | Discharge: 2012-02-11 | Disposition: A | Discharge status: Home / Self Care | Payer: Medicaid Other | Source: Ambulatory Visit

## 2012-02-11 ENCOUNTER — Encounter (HOSPITAL_BASED_OUTPATIENT_CLINIC_OR_DEPARTMENT_OTHER): Payer: Self-pay

## 2012-02-11 ENCOUNTER — Ambulatory Visit (HOSPITAL_BASED_OUTPATIENT_CLINIC_OR_DEPARTMENT_OTHER): Payer: Medicaid Other

## 2012-02-11 DIAGNOSIS — O368190 Decreased fetal movements, unspecified trimester, not applicable or unspecified: Secondary | ICD-10-CM | POA: Insufficient documentation

## 2012-02-11 NOTE — Progress Notes (Signed)
BPP 8/8, NST reactive with audible fetal movements that pt states she does not feel.  [place] reports that AFI is 5.4 at this time.  Consulted Dr Orson Slick who recommends discharge to home, repeat AFI in one week.  Reviewed discharge instructions and provided req for [place].  RTO as scheduled.

## 2012-02-11 NOTE — Nurses Notes (Signed)
Pt arrived for BPP, OB watch and Pulse Ox reading.

## 2012-02-11 NOTE — Nurses Notes (Signed)
 AVS reviewed with pt with CNM L Wyatt at bedside. Pt verbalized understanding and signed AVS. Pt to be discharged and to schedule US  appt for next week.

## 2012-02-11 NOTE — Nurses Notes (Signed)
Pt off EFM and TOCO for BPP

## 2012-02-15 ENCOUNTER — Other Ambulatory Visit (HOSPITAL_BASED_OUTPATIENT_CLINIC_OR_DEPARTMENT_OTHER): Payer: Self-pay

## 2012-02-17 ENCOUNTER — Ambulatory Visit (HOSPITAL_BASED_OUTPATIENT_CLINIC_OR_DEPARTMENT_OTHER)
Admission: RE | Admit: 2012-02-17 | Discharge: 2012-02-17 | Disposition: A | Discharge status: Home / Self Care | Payer: Medicaid Other | Source: Ambulatory Visit

## 2012-02-19 ENCOUNTER — Encounter (HOSPITAL_BASED_OUTPATIENT_CLINIC_OR_DEPARTMENT_OTHER): Payer: Self-pay

## 2012-02-19 ENCOUNTER — Inpatient Hospital Stay (HOSPITAL_BASED_OUTPATIENT_CLINIC_OR_DEPARTMENT_OTHER)
Admission: RE | Admit: 2012-02-19 | Discharge: 2012-02-20 | DRG: 775 | Disposition: A | Discharge status: Home / Self Care | Payer: Medicaid Other

## 2012-02-19 DIAGNOSIS — D649 Anemia, unspecified: Secondary | ICD-10-CM | POA: Diagnosis present

## 2012-02-19 DIAGNOSIS — O9902 Anemia complicating childbirth: Principal | ICD-10-CM | POA: Diagnosis present

## 2012-02-19 LAB — CBC
BASOPHIL #: 0.07 10*3/uL (ref 0.00–0.10)
BASOPHILS %: 0.5 % (ref 0.0–1.4)
EOSINOPHIL #: 0.15 K/uL (ref 0.00–0.50)
EOSINOPHIL %: 1.1 % (ref 0.0–5.2)
HCT: 38.2 % (ref 36.0–45.0)
HGB: 13.1 g/dL (ref 12.0–15.5)
LYMPHOCYTE #: 2.69 10*3/uL (ref 0.70–3.20)
LYMPHOCYTE %: 19.9 % (ref 15.0–43.0)
MCH: 28.4 pg (ref 28.0–34.0)
MCHC: 34.4 g/dL (ref 33.0–37.0)
MCV: 82.6 fL (ref 82.0–97.0)
MONOCYTE #: 1.03 10*3/uL — ABNORMAL HIGH (ref 0.20–0.90)
MONOCYTE %: 7.6 % (ref 4.8–12.0)
MPV: 7.8 fL (ref 7.0–9.4)
PLATELET COUNT: 282 10*3/uL (ref 150–400)
PMN #: 9.56 K/uL — ABNORMAL HIGH (ref 1.50–6.50)
PMN %: 70.8 % (ref 43.0–76.0)
RBC: 4.62 M/uL (ref 4.00–5.10)
RDW: 12.9 % (ref 11.0–13.0)
WBC: 13.5 10*3/uL — ABNORMAL HIGH (ref 4.0–11.0)

## 2012-02-19 LAB — TYPE AND SCREEN - BMC/JMC ONLY
ABO/RH(D): A POS
ANTIBODY SCREEN: NEGATIVE

## 2012-02-19 MED ORDER — LACTATED RINGERS INTRAVENOUS SOLUTION
INTRAVENOUS | Status: DC
Start: 2012-02-19 — End: 2012-02-20

## 2012-02-19 MED ORDER — ACETAMINOPHEN 300 MG-CODEINE 30 MG TABLET
2.00 | ORAL_TABLET | ORAL | Status: DC | PRN
Start: 2012-02-19 — End: 2012-02-20
  Administered 2012-02-20: 2 via ORAL
  Filled 2012-02-19: qty 2

## 2012-02-19 MED ORDER — DIPHTH,PERTUS(ACEL)TETANUS(PF)2LF-(2.5-5-3-5MCG)-5 LF/0.5 ML IM SUSP
0.5000 mL | Freq: Once | INTRAMUSCULAR | Status: AC
Start: 2012-02-19 — End: 2012-02-20
  Administered 2012-02-20: 0.5 mL via INTRAMUSCULAR
  Filled 2012-02-19: qty 0.5

## 2012-02-19 MED ORDER — IBUPROFEN 800 MG TABLET
800.00 mg | ORAL_TABLET | Freq: Four times a day (QID) | ORAL | Status: DC
Start: 2012-02-19 — End: 2012-02-20
  Administered 2012-02-19 – 2012-02-20 (×4): 800 mg via ORAL
  Filled 2012-02-19 (×4): qty 1

## 2012-02-19 MED ORDER — FENTANYL-ROPIVACAINE IN NS 2MCG/ML -0.2 % PCEA - CHI
INTRAMUSCULAR | Status: DC
Start: 2012-02-19 — End: 2012-02-20
  Administered 2012-02-19: 8 mL/h via EPIDURAL
  Filled 2012-02-19: qty 100

## 2012-02-19 MED ORDER — NALOXONE 0.4 MG/ML INJECTION SOLUTION
0.2000 mg | INTRAMUSCULAR | Status: DC | PRN
Start: 2012-02-19 — End: 2012-02-20

## 2012-02-19 MED ORDER — SODIUM CHLORIDE 0.9 % (FLUSH) INJECTION SYRINGE
10.0000 mL | INJECTION | Freq: Three times a day (TID) | INTRAMUSCULAR | Status: DC
Start: 2012-02-19 — End: 2012-02-20

## 2012-02-19 MED ORDER — FENTANYL-ROPIVACAINE 2 MCG/ML-0.2% 15 ML INJECTION - CHI
10.0000 mL | Freq: Once | INTRAMUSCULAR | Status: AC
Start: 2012-02-19 — End: 2012-02-19
  Administered 2012-02-19: 10 mL via EPIDURAL
  Filled 2012-02-19: qty 15

## 2012-02-19 MED ORDER — LIDOCAINE (PF) 10 MG/ML (1 %) INJECTION SOLUTION
Freq: Once | INTRAMUSCULAR | Status: DC | PRN
Start: 2012-02-19 — End: 2012-02-20

## 2012-02-19 MED ORDER — SENNOSIDES 8.6 MG TABLET
8.60 mg | ORAL_TABLET | Freq: Every evening | ORAL | Status: DC
Start: 2012-02-19 — End: 2012-02-20
  Administered 2012-02-19: 8.6 mg via ORAL
  Filled 2012-02-19: qty 1

## 2012-02-19 MED ORDER — OXYTOCIN 20 UNIT/1000 ML IN 0.9 % SODIUM CHLORIDE INTRAVENOUS
166.00 mL/h | INTRAVENOUS | Status: DC
Start: 2012-02-19 — End: 2012-02-20

## 2012-02-19 MED ORDER — IBUPROFEN 800 MG TABLET
800.00 mg | ORAL_TABLET | Freq: Once | ORAL | Status: AC | PRN
Start: 2012-02-19 — End: 2012-02-19
  Administered 2012-02-19: 800 mg via ORAL
  Filled 2012-02-19: qty 1

## 2012-02-19 MED ORDER — OXYTOCIN 20 UNIT/1000 ML IN 0.9 % SODIUM CHLORIDE INTRAVENOUS
INTRAVENOUS | Status: AC
Start: 2012-02-19 — End: 2012-02-19
  Administered 2012-02-19: 20 [IU] via INTRAVENOUS
  Filled 2012-02-19: qty 1000

## 2012-02-19 MED ORDER — BISACODYL 10 MG RECTAL SUPPOSITORY
10.00 mg | Freq: Once | RECTAL | Status: DC | PRN
Start: 2012-02-20 — End: 2012-02-20

## 2012-02-19 MED ORDER — ONDANSETRON HCL (PF) 4 MG/2 ML INJECTION SOLUTION
4.00 mg | Freq: Once | INTRAMUSCULAR | Status: DC | PRN
Start: 2012-02-19 — End: 2012-02-20

## 2012-02-19 MED ORDER — MEASLES,MUMPS,RUBELLA VACCINE LIVE(PF)1,000-12,500TCID50/0.5 ML SUBCUT
0.5000 mL | Freq: Once | SUBCUTANEOUS | Status: DC
Start: 2012-02-19 — End: 2012-02-20
  Filled 2012-02-19: qty 5

## 2012-02-19 MED ORDER — ACETAMINOPHEN 300 MG-CODEINE 30 MG TABLET
2.00 | ORAL_TABLET | Freq: Once | ORAL | Status: DC | PRN
Start: 2012-02-19 — End: 2012-02-20

## 2012-02-19 MED ORDER — EPHEDRINE SULFATE 50 MG/ML INJECTION SOLUTION
15.00 mg | INTRAMUSCULAR | Status: DC | PRN
Start: 2012-02-19 — End: 2012-02-20

## 2012-02-19 NOTE — Nurses Notes (Signed)
Admitted to unit.Transferred to room 724 and placed on Ext EFM.Oriented to room.See Admission Navigator.Orders noted.Discussed plan of care with patient and family.

## 2012-02-19 NOTE — Nurses Notes (Signed)
 Pt to recovery after vag delivery and shoulder dystocia.

## 2012-02-19 NOTE — OR Nursing (Signed)
Surgical/Procedural Safety Checklist  Pre-Procedure Checklist   (based on the World Health Organization Surgical Safety Checklist)    Procedure performed:  Epidural      PRE-PROCEDURE:  BEFORE THE INDUCTION OF ANESTHESIA (with at least one nurse and anesthetist)    Has the patient confirmed his/her identity, site, procedure and signed consent on patient record?  Yes    History and physical complete, singed and on chart?  Yes    Pre-Anesthesia assessment complete and on chart?  Yes    Correct side or site - VERIFY WITH PATIENT AND PHYSICIAN?  N/A    Is the site marked by the surgeon/physician by printing first, middle and last initial?  Yes    Is the anesthesia machine and medication check complete, if applicable?  Yes    Is the pulse oximeter on the patient and functioning?  Yes    Does the patient have a known allergy and documented in chart?  Yes    Difficult airway or aspiration risk?  No    Risk of > 500 ml blood loss (56ml/kg in children)?No    Beta blocker administered pre-procedure if applicable?  N/A    This portion of the checklist completed prior to patient entering the procedural area?  Yes      Lennox Pippins, RN   02/19/2012  7:46 AM

## 2012-02-19 NOTE — Nurses Notes (Signed)
Pt more comfortable after epidural. Pt feeling "a little" rectal pressure.

## 2012-02-19 NOTE — Nurses Notes (Signed)
21 yr female presented to unit via wheel chair C/O ctx. Oriented to room and placed on Ext EFM.States fetal movement.V/S stable. [redacted] weeks gestation. See OB triage navigator.Family by side and supportive.

## 2012-02-19 NOTE — Nurses Notes (Signed)
Pt to r side, decels noted

## 2012-02-19 NOTE — Nurses Notes (Signed)
Repost given to M.Harlene Ramus.

## 2012-02-19 NOTE — H&P (Signed)
[facility]  [place], [place] [address]    History and Physical - Labor Admission      [name] [name], 21 y.o. female  Date of Admission:  02/19/2012  Date of Service: 02/19/2012  Date of Birth:  09/07/1990    Subjective:    Reason for Admission:  onset of labor    Lab Results   Component Value Date    ABORHD A POSITIVE 12/02/2009    HGB 12.0 01/27/2012    HCT 36.7 01/27/2012     HIV: neg VDRL:  NR  HH:  12.3 & 36.7  Urine:  normal  Hep B SAG:  neg  GBS:  -  Rubella:  immune  GC:  neg    HPI:  Pt is a 21 y.o. G3P1001 @[redacted]w[redacted]d  who came to the unit w/c/o ctxs that got strong @1400 . Could not sleep through them and decided needed to be seen. Reports good FM and denies vag bleeding and SROM.  Began prenatal care @9  5/7wks and had 11 visits. Total wt gain 35 lbs.      OB History     Grav Para Term Preterm Abortions TAB SAB Ect Mult Living    3 1 1  0 0 0 0 0 0 1       # Outc Date GA Lbr Len/2nd Wgt Sex Del Anes PTL Lv    1 TRM             Comments: System Generated. Please review and update pregnancy details.    2 GRA             Comments: System Generated. Please review and update pregnancy details.    3 CUR                 Patient's last menstrual period was 05/22/2011.   Estimated Date of Delivery: 8/16/[redacted]     Weeks gestation:  [redacted]w[redacted]d  Past Medical History   Diagnosis Date   . ASCUS on Pap smear for follow-up postpartum 06/05/2009     Past Surgical History   Procedure Date   . Hx fracture tx 2009      FIXATION OF JAW FRACTURE   . Hx fracture tx      Fracture TX     Medications Prior to Admission     Medication    prenatal vitamin-iron-folate (NATALCARE PLUS) 27-0.8 mg Oral Tablet    Take 1 Tab by mouth Once a day          Current Facility-Administered Medications:  lidocaine PF (XYLOCAINE-MPF) 1% injection  Intradermal Once PRN   NS flush syringe 10 mL Intravenous Q8HRS   LR premix infusion  Intravenous Continuous     Allergies   Allergen Reactions   . Imitrex (Sumatriptan Succinate) Rash    . Other Rash     Bleach     History   Substance Use Topics   . Smoking status: Former Smoker -- 0.5 packs/day for 4 years   . Smokeless tobacco: Former Neurosurgeon     Quit date: 05/30/2011    Comment: quit when she found out she was pregnant   . Alcohol Use: No     History   Drug Use No     Family History   Problem Relation Age of Onset   . Adopted: Yes                   Temperature: 36.7 C (98.1 F)  Heart Rate: 83  BP (Non-Invasive): 122/77 mmHg  Respiratory Rate: 20   Pain Score (Numeric, Faces): 7    Other than ROS in the HPI, all other systems were negative.    Labs:  pending    Objective:     General: appears in good health  Neck: supple, symmetrical, trachea midline  Lungs: Clear to auscultation bilaterally.   Cardiovascular: regular rate and rhythm  Abdomen: Soft, non-tender, Bowel sounds normal  Extremities: pedal edema 1+ bilateral  Skin: Skin warm and dry  Neurologic: Normal mental status, sensory, motor, cranial nerves, reflexes, coordination, and gait.  Psychiatric: Normal affect, behavior, memory, thought content, judgement, and speech.      Cervical Exam:      Dilation: 6cm   Effacement: 100%    Station:             -1    Position:  Anterior   Consistency:      Soft   Bishop Score:     12       Bishop's Scoring System   0 1 2 3    Dilatation 0 1 - 2 3 - 4 5 or more   Effacement 0 - 30 40 - 50 60 - 70 80 or more   Station -3 -2 -1,0 +1,+2   Position Posterior Mid Anterior    Consistency Firm Medium Soft        Fetal Data:     Multiple Birth: No    Presentation: Vertex     FHR Monitoring Category: Category I    Fetal heart rate tracing:      Heart Rate: 130  Accelerations: Present  Decelerations: None  Variability: Moderate    Estimated Fetal Weight: 7.5 lbs    Contractions:    Frequency Range: q 3-5 mins    Duration Range: 60 - 90 seconds  Contraction pattern: Regular  Intensity: Moderate      Assessment:   Active Hospital Problems    Diagnosis   . Primary Problem: Active labor   . Hx MRSA infection    . Normal pregnancy, repeat   . Anemia in pregnancy       Plan:   1. Admit for labor.  2. Anesthesia consult if pt requests epidural. Pt is unsure at this time.  3. Continue to monitor maternal and fetal wellbeing.  4. Anticipate NSVD.  5. Assist w/natural childbirth as needed.

## 2012-02-19 NOTE — Nurses Notes (Signed)
CRNA here to consent for epidural

## 2012-02-19 NOTE — Procedures (Addendum)
[facility]  [place], [place] [address]    Labor and Delivery Summary      [name],[name], 21 y.o. female  Date of Admission:  02/19/2012  Date of Service: 02/19/2012  Date of Birth:  [birth year]      LABOR    Labor Events: none    Augmentation/Induction:  Not applicable      DELIVERY    Procedure:  spontaneous vaginal  Provider(s): Esperanza Richters CNM    Anesthesia:  epidural    EBL:  250 mL    Specimens: Cord blood    Episiotomy/Laceration:  Intact     Placenta: spontaneous, normal    Complications: None    FINDINGS          Infant data:        Gender: Female        Birth Weight: 3.63 kg (8 lb)        Apgar 1 Minute Total: 8 , Apgar 5 Minute Total: 9              Umbilical Cord: Baby A:  3 vessel cord        Patient condition Stable  Tolerated the procedure Well    COMMENTS:  Pt labored down to reduce an anterior lip, was found to be complete at 1100, Pushed well and delivered a viable female NB in OA position, over an intact perineum at 1116.  NB placed on maternal abdomen for nursing evaluation, APGARs 8 & 9, weight 8 lbs.  Cord was double clamped by CNM and cut by FOB, cord blood collected.  Active management of third stage using a 75 ml bolus of 1000 ml NS with 20 units pitocin added.  Spontaneous expulsion of intact placenta occurred at 1127.  Remaining Pitocin infused.  Vagina, cervix, perineum, and rectum inspected and found to be intact.   EBL 250 ml.          After delivery of chin it took another 60 seconds for shoulders to be delivered.  Nurses  Performed McRoberts maneuver.  Delivery was accomplished without further complication.

## 2012-02-19 NOTE — Nurses Notes (Signed)
Patient placed in tub for pain relief, states she feels much better.

## 2012-02-19 NOTE — Progress Notes (Signed)
 Pt states she is very uncomfortable and requesting epidural.  Offered her the option of AROM which may speed things up but pt prefers to wait until after epidural.  EFM strip Category I.

## 2012-02-19 NOTE — Progress Notes (Signed)
Pt feeling some rectal pressure, 9cms by exam and intact.  Advised pt that AROM will allow the head to descend and delivery will be iminent.  She wants to wait a few minutes till her mother arrives then will proceed with AROM.  EFM strip Category II with variables during contractions.

## 2012-02-20 ENCOUNTER — Encounter (HOSPITAL_BASED_OUTPATIENT_CLINIC_OR_DEPARTMENT_OTHER): Payer: Self-pay

## 2012-02-20 LAB — CBC
BASOPHIL #: 0.07 10*3/uL (ref 0.00–0.10)
BASOPHILS %: 0.6 % (ref 0.0–1.4)
EOSINOPHIL #: 0.18 10*3/uL (ref 0.00–0.50)
EOSINOPHIL %: 1.4 % (ref 0.0–5.2)
HCT: 34.2 % — ABNORMAL LOW (ref 36.0–45.0)
LYMPHOCYTE #: 3.13 10*3/uL (ref 0.70–3.20)
LYMPHOCYTE %: 24.5 % (ref 15.0–43.0)
MCH: 28.5 pg (ref 28.0–34.0)
MCHC: 34.2 g/dL (ref 33.0–37.0)
MCV: 83.3 fL (ref 82.0–97.0)
MONOCYTE #: 0.98 K/uL — ABNORMAL HIGH (ref 0.20–0.90)
MONOCYTE %: 7.7 % (ref 4.8–12.0)
MPV: 8.3 fL (ref 7.0–9.4)
PLATELET COUNT: 241 K/uL (ref 150–400)
PMN #: 8.42 K/uL — ABNORMAL HIGH (ref 1.50–6.50)
PMN %: 65.8 % (ref 43.0–76.0)
RBC: 4.11 M/uL (ref 4.00–5.10)
RDW: 13.3 % — ABNORMAL HIGH (ref 11.0–13.0)
WBC: 12.8 10*3/uL — ABNORMAL HIGH (ref 4.0–11.0)

## 2012-02-20 MED ORDER — IBUPROFEN 800 MG TABLET
800.0000 mg | ORAL_TABLET | Freq: Three times a day (TID) | ORAL | Status: DC | PRN
Start: 2012-02-20 — End: 2013-09-18

## 2012-02-20 NOTE — Nurses Notes (Signed)
Assumed care of newborn after report from V. Tyeryar, RN.

## 2012-02-20 NOTE — Nurses Notes (Signed)
Discharge papers gone over and all questions answered.

## 2012-02-20 NOTE — Nurses Notes (Signed)
Patient verbalizes c/o needing to have bowel movement with some cramping related. Patient ambulates to bathroom adlib, bowel sounds present X 4 quads. Discussed options with patient regarding medication and prune juice given. Patient feeding newborn at present, per patient will let this nurse know decision on PRN medication for c/o "needing to have bowel movement".

## 2012-02-20 NOTE — Discharge Instructions (Signed)
Postpartum Care After Vaginal Delivery  After you deliver your baby, you will stay in the hospital for 24 to 72 hours, unless there were problems with the labor or delivery, or you have medical problems. While you are in the hospital, you will receive help and instructions on how to care for yourself and your baby.  Your doctor will order pain medicine, in case you need it. You will have a small amount of bleeding from your vagina and should change your sanitary pad frequently. Wash your hands thoroughly with soap and water for at least 20 seconds after changing pads and using the toilet. Let the nurses know if you begin to pass blood clots or your bleeding increases. Do not flush blood clots down the toilet before having the nurse look at them, to make sure there is no placental tissue with them.  If you had an intravenous, it will be removed within 24 hours, if there are no problems. The first time you get out of bed or take a shower, call the nurse to help you because you may get weak, lightheaded, or even faint. If you are breastfeeding, you may feel painful contractions of your uterus for a couple of weeks. This is normal. The contractions help your uterus get back to normal size. If you are not breastfeeding, wear a tight and binding bra. Hormones should not be given to dry up the breasts, because they can cause blood clots. You will be given your normal diet, unless you have diabetes or other medical problems.   The nurses may put an ice pack on your episiotomy (surgically enlarged opening), if you have one, to reduce the pain and puffiness (swelling). On rare occasions, you may not be able to urinate and the nurse will need to empty your bladder with a catheter. If you had a postpartum tubal ligation ("tying tubes," female sterilization), it should not make your stay in the hospital longer.  You may have your baby in your room with you as much as you like, unless you or the baby has a problem. Use the  bassinet (basket) for the baby when going to and from the nursery. Do not carry the baby. Do not leave the postpartum area. If the mother is Rh negative (lacks a protein on the red blood cells) and the baby is Rh positive, the mother should get a Rho-gam shot to prevent Rh problems with future pregnancies.  You may be given written instructions for you and your baby, and necessary medicines, when you are discharged from the hospital. Be sure you understand and follow the instructions as advised.  HOME CARE INSTRUCTIONS AFTER YOUR DELIVERY:  · Follow instructions and take the medicines given to you.   · Only take over-the-counter or prescription medicines for pain, discomfort, or fever as directed by your caregiver.   · Do not take aspirin, because it can cause bleeding.   · Increase your activities a little bit every day to build up your strength and endurance.   · Do not drink alcohol, especially if you are breastfeeding or taking pain medicine.   · Take your temperature twice a day and record it.   · You may have a small amount of bleeding or spotting for 2 to 4 weeks. This is normal.   · Do not use tampons or douche. Use sanitary pads.   · Try to have someone stay and help you for a few days when you go home.   · Try   to rest or take a nap when the baby is sleeping.   · If you are breastfeeding, wear a good support bra. If you are not breastfeeding, wear a tight bra, and do not stimulate your nipples.   · Eat a healthy, nutritious diet and continue to take your prenatal vitamins.   · Do not drive, do any heavy activities or travel until your caregiver tells you it is OK.   · Do not have intercourse until your caregiver gives you permission to do so.   · Ask your caregiver when you can begin to exercise and what type of exercises to do.   · Call your caregiver if you think you are having a problem from your delivery.   · Call your pediatrician if you are having a problem with the baby.   · Schedule your postpartum  visit and keep it.   SEEK MEDICAL CARE IF:  · You have a temperature of 100° F (37.8° C) or higher.   · You have increased vaginal bleeding or are passing clots. Save any clots to show your caregiver.   · You have bloody urine, or pain when you urinate.   · You have a bad smelling vaginal discharge.   · You have increasing pain or swelling on your episiotomy (surgically enlarged opening).   · You develop a severe headache.   · You feel depressed.   · The episiotomy is separating.   · You become dizzy or lightheaded.   · You develop a rash.   · You have a reaction or problems with your medicine.   · You have pain, redness, and/or swelling at the intravenous site.   SEEK IMMEDIATE MEDICAL CARE IF:  · You have chest pain.   · You develop shortness of breath.   · You pass out.   · You develop pain, with or without swelling or redness in your leg.   · You develop heavy vaginal bleeding, with or without blood clots.   · You develop stomach pain.   · You develop a bad smelling vaginal discharge.   MAKE SURE YOU:   · Understand these instructions.   · Will watch your condition.   · Will get help right away if you are not doing well or get worse.   Document Released: 04/26/2007 Document Re-Released: 08/19/2010  ExitCare® Patient Information ©2012 [employer].Postpartum Depression and Baby Blues  The postpartum period begins right after the birth of a baby. During this time, there is often a great amount of joy and excitement. It is also a time of considerable changes in the life of the parent(s). Regardless of how many times a mother gives birth, each child brings new challenges and dynamics to the family. It is not unusual to have feelings of excitement accompanied by confusing shifts in moods, emotions, and thoughts. All mothers are at risk of developing postpartum depression or the "baby blues." These mood changes can occur right after giving birth, or they may occur many months after giving birth. The baby blues or  postpartum depression can be mild or severe. Additionally, postpartum depression can resolve rather quickly, or it can be a long-term condition.  CAUSES  Elevated hormones and their rapid decline are thought to be a main cause of postpartum depression and the baby blues. There are a number of hormones that radically change during and after pregnancy. Estrogen and progesterone usually decrease immediately after delivering your baby. The level of thyroid hormone and various cortisol   steroids also rapidly drop. Other factors that play a major role in these changes include major life events and genetics.   RISK FACTORS  If you have any of the following risks for the baby blues or postpartum depression, know what symptoms to watch out for during the postpartum period. Risk factors that may increase the likelihood of getting the baby blues or postpartum depression include:  · Having a personal or family history of depression.  · Having depression while being pregnant.  · Having premenstrual or oral contraceptive-associated mood issues.  · Having exceptional life stress.  · Having marital conflict.  · Lacking a social support network.  · Having a baby with special needs.  · Having health problems such as diabetes.  SYMPTOMS  Baby blues symptoms include:  · Brief fluctuations in mood, such as going from extreme happiness to sadness.  · Decreased concentration.  · Difficulty sleeping.  · Crying spells, tearfulness.  · Irritability.  · Anxiety.  Postpartum depression symptoms typically begin within the first month after giving birth. These symptoms include:  · Difficulty sleeping or excessive sleepiness.  · Marked weight loss.  · Agitation.  · Feelings of worthlessness.  · Lack of interest in activity or food.  Postpartum psychosis is a very concerning condition and can be dangerous. Fortunately, it is rare. Displaying any of the following symptoms is cause for immediate medical attention. Postpartum psychosis symptoms  include:  · Hallucinations and delusions.  · Bizarre or disorganized behavior.  · Confusion or disorientation.  DIAGNOSIS   A diagnosis is made by an evaluation of your symptoms. There are no medical or lab tests that lead to a diagnosis, but there are various questionnaires that a caregiver may use to identify those with the baby blues, postpartum depression, or psychosis. Often times, a screening tool called the Edinburgh Postnatal Depression Scale is used to diagnose depression in the postpartum period.   TREATMENT  The baby blues usually goes away on its own in 1 to 2 weeks. Social support is often all that is needed. You should be encouraged to get adequate sleep and rest. Occasionally, you may be given medicines to help you sleep.   Postpartum depression requires treatment as it can last several months or longer if it is not treated. Treatment may include individual or group therapy, medicine, or both to address any social, physiological, and psychological factors that may play a role in the depression. Regular exercise, a healthy diet, rest, and social support may also be strongly recommended.   Postpartum psychosis is more serious and needs treatment right away. Hospitalization is often needed.  HOME CARE INSTRUCTIONS  · Get as much rest as you can. Nap when the baby sleeps.  · Exercise regularly. Some women find yoga and walking to be beneficial.  · Eat a balanced and nourishing diet.  · Do little things that you enjoy. Have a cup of tea, take a bubble bath, read your favorite magazine, or listen to your favorite music.  · Avoid alcohol.  · Ask for help with household chores, cooking, grocery shopping, or running errands as needed. Do not try to do everything.  · Talk to people close to you about how you are feeling. Get support from your partner, family members, friends, or other new moms.  · Try to stay positive in how you think. Think about the things you are grateful for.  · Do not spend a lot of time  alone.  · Only take medicine as   directed by your caregiver.  · Keep all your postpartum appointments.  · Let your caregiver know if you have any concerns.  SEEK MEDICAL CARE IF:  You are having a reaction or problems with your medicine.  SEEK IMMEDIATE MEDICAL CARE IF:  · You have suicidal feelings.  · You feel you may harm the baby or someone else.  Document Released: 04/02/2004 Document Revised: 06/18/2011 Document Reviewed: 05/05/2011  ExitCare® Patient Information ©2012 [employer].

## 2012-02-20 NOTE — Discharge Summary (Signed)
[facility]  [place], [place] [address]    DISCHARGE SUMMARY      PATIENT NAME:  [name] [name], [name] [name]  MRN:  [id number]  DOB:  July 17, 1990    ADMISSION DATE:  02/19/2012  DISCHARGE DATE:  02/20/2012    ATTENDING PHYSICIAN: Sanjuan Dame, CNM  PRIMARY CARE PHYSICIAN: Mattie Marlin, MD     ADMISSION DIAGNOSIS: Active labor  Chief Complaint   Patient presents with   . Laboring       DISCHARGE DIAGNOSIS:   Active Hospital Problems    Diagnosis Date Noted   . Postpartum care following vaginal delivery 02/20/2012   . Hx MRSA infection 02/19/2012      Resolved Hospital Problems    Diagnosis    . Principle Problem: Active labor    . Normal pregnancy, repeat    . Anemia in pregnancy      Active Non-Hospital Problems    Diagnosis Date Noted   . Decreased fetal movement 02/05/2012   . ASCUS on Pap smear for follow-up postpartum 06/05/2009   . Chlamydia infection, current pregnancy, treated 06/05/2009      DISCHARGE MEDICATIONS:  Current Discharge Medication List      START taking these medications    Details   Ibuprofen (MOTRIN) 800 mg Oral Tablet Take 1 Tab (800 mg total) by mouth Three times a day as needed for Pain  Qty: 50 Tab, Refills: 0         STOP taking these medications       prenatal vitamin-iron-folate (NATALCARE PLUS) 27-0.8 mg Oral Tablet Comments:   Reason for Stopping:             DISCHARGE INSTRUCTIONS:     DISCHARGE INSTRUCTION - MISC   Please call with any of the following, or as needed:    - signs/symptoms of depression  - temperature greater than 100.4 degrees F  - bleeding:  soaking more than 1 pad/hour  - pain worsens, not managed with motrin  - foul odor from vagina  - red/warm/painful area on breast     SCHEDULE FOLLOW-UP (NON-EPIC) PHYSICIAN   Follow-up in: 6 WEEKS    Reason for visit: HOSPITAL DISCHARGE      REASON FOR HOSPITALIZATION AND HOSPITAL COURSE:  This is a 21 y.o., female PP s/p NSVD.  See note  SIGNIFICANT PHYSICAL FINDINGS: see note    [facility]  [place], [place]  [address]    OB POST PARTUM PROGRESS NOTE      [name],[name], 21 y.o., G2P2002  Date of Admission:  02/19/2012  Date of Birth:  [birth year]  MRN:  [id number]  CSN:   [id number]  Date of Service:  02/20/2012    Post partum day 1,  Type of Delivery:  spontaneous vaginal    Subjective: Pt reports feeling well and desires to go home.  Bottle feeding going well.  Reports small bleeding, minimal cramping managed with motrin, would like script to go home with.  Denies difficulty voiding, denies flatus, denies bm.  Interested in nexplanon or paragard for birth control.    RH:  +GBS:  - Rubella:  immune Infant Gender:  Female  Baby is feeding via bottle    Vital Signs:    Temp (24hrs) Max:36.8 C (98.2 F)    Temp (48hrs) Max:36.9 C (98.4 F)    Temperature: 36.7 C (98.1 F)  BP (Non-Invasive): 110/71 mmHg  Heart Rate: 71   Respiratory Rate: 16   Pain  Score (Numeric, Faces): 5 (patient relates pain to needing to have bowel movement)    Labs:  I have reviewed all lab results.  Lab Results for Last 24 Hours:  No results found for any visits on 02/19/12 (from the past 24 hour(s)).    Fundus:  firm at u - 1  Fundal consistency: Firm  Palpation: Non-tender  Perineum:  No hematoma, No signs/symptoms of infection, No edema, No ecchymosis  Lochia: Small      Additional Physical Exam:  Vitals: BP 110/71   Pulse 71   Temp 36.7 C (98.1 F)   Resp 16   Ht 1.727 m (5\' 8" )   Wt 103.42 kg (228 lb)   BMI 34.67 kg/m2   LMP 05/22/2011   Breastfeeding? Unknown  Lungs: clear to auscultation bilaterally.   Cardiovascular:    Heart regular rate and rhythm  Abdomen: soft, non-tender and bowel sounds normal  Extremities: No edema  Skin: Skin warm and dry  Neurologic: grossly normal  Psychiatric: AOx3 and normal    Assessment:  21 yo G2P2 PP s/p NSVD.    Active Hospital Problems    Diagnosis   . Postpartum care following vaginal delivery   . Hx MRSA infection       Plan:       1.  Reviewed self-care, comfort measures, and discharge instructions.  Pt desires nexplanon or paragard for birth control.  2.  Pt given motrin script and instructions.    3.  Reviewed warning s/s (depression, infection, hemorrhage) and when to call.  4.  RTO in 6 wks/PRN.  5.  Discharge home.          SIGNIFICANT LAB: see note  PROCEDURES PERFORMED: bedside vaginal    COURSE IN HOSPITAL: uncomplicated    CONDITION ON DISCHARGE: Alert, Oriented and VS Stable    DISCHARGE DISPOSITION:  Home discharge     cc: Primary Care Physician:  Mattie Marlin, MD  [address]  [place] [address]     [id number] Physician:  No referring provider defined for this encounter.

## 2012-02-20 NOTE — Consults (Signed)
[facility]  [place], [place] [address]    ANESTHESIA POSTOP EVALUATION NOTE    [name],[name], 21 y.o. female  Date of Birth:  January 27, 1991    02/20/2012    Temperature: 36.8 C (98.2 F) (02/19/12 2100)  Heart Rate: 73  (02/20/12 0000)  BP (Non-Invasive): 128/71 mmHg (02/20/12 0000)  Respiratory Rate: 20  (02/20/12 0000)  Pain Score (Numeric, Faces): 2 (02/20/12 6213)    Patient should be sufficiently recovered from the effects of anesthesia to partcipate in the evaluation or has returned to their pre-procedure level.    I have reviewed and evaluated the following:  Respiratory Function:  Consistent with pre anesthetic level  Cardiovascular Function:  Consistent with pre anesthetic level  Mental Status:  Return to pre anesthetic baseline level  Pain:  Sufficiently controlled with medication  Nausea and Vomiting:  Absent or sufficiently controlled with medication    Post operative complications: None  Comment/ re-evaluation for any variations:

## 2012-07-22 ENCOUNTER — Ambulatory Visit (INDEPENDENT_AMBULATORY_CARE_PROVIDER_SITE_OTHER): Payer: Self-pay | Admitting: UHP HARPERS FERRY FM

## 2012-07-22 ENCOUNTER — Ambulatory Visit (INDEPENDENT_AMBULATORY_CARE_PROVIDER_SITE_OTHER): Payer: Medicaid Other

## 2012-07-22 ENCOUNTER — Encounter (INDEPENDENT_AMBULATORY_CARE_PROVIDER_SITE_OTHER): Payer: Self-pay

## 2012-07-22 VITALS — BP 127/78 | HR 100 | Temp 98.1°F | Ht 65.0 in | Wt 208.0 lb

## 2012-07-22 MED ORDER — CYCLOBENZAPRINE 5 MG TABLET
5.00 mg | ORAL_TABLET | Freq: Three times a day (TID) | ORAL | Status: DC
Start: 2012-07-22 — End: 2013-09-18

## 2012-07-22 MED ORDER — NAPROXEN SODIUM 550 MG TABLET
550.00 mg | ORAL_TABLET | Freq: Two times a day (BID) | ORAL | Status: DC
Start: 2012-07-22 — End: 2013-09-18

## 2012-07-22 MED ORDER — PREDNISONE 20 MG TABLET
40.0000 mg | ORAL_TABLET | Freq: Every day | ORAL | Status: DC
Start: 2012-07-22 — End: 2013-09-18

## 2012-07-22 NOTE — Patient Instructions (Addendum)
Back Exercises  Back exercises help treat and prevent back injuries. The goal of back exercises is to increase the strength of your abdominal and back muscles and the flexibility of your back. These exercises should be started when you no longer have back pain. Back exercises include:   Pelvic Tilt. Lie on your back with your knees bent. Tilt your pelvis until the lower part of your back is against the floor. Hold this position 5 to 10 sec and repeat 5 to 10 times.   Knee to Chest. Pull first 1 knee up against your chest and hold for 20 to 30 seconds, repeat this with the other knee, and then both knees. This may be done with the other leg straight or bent, whichever feels better.   Sit-Ups or Curl-Ups. Bend your knees 90 degrees. Start with tilting your pelvis, and do a partial, slow sit-up, lifting your trunk only 30 to 45 degrees off the floor. Take at least 2 to 3 seconds for each sit-up. Do not do sit-ups with your knees out straight. If partial sit-ups are difficult, simply do the above but with only tightening your abdominal muscles and holding it as directed.   Hip-Lift. Lie on your back with your knees flexed 90 degrees. Push down with your feet and shoulders as you raise your hips a couple inches off the floor; hold for 10 seconds, repeat 5 to 10 times.   Back arches. Lie on your stomach, propping yourself up on bent elbows. Slowly press on your hands, causing an arch in your low back. Repeat 3 to 5 times. Any initial stiffness and discomfort should lessen with repetition over time.   Shoulder-Lifts. Lie face down with arms beside your body. Keep hips and torso pressed to floor as you slowly lift your head and shoulders off the floor.   Do not overdo your exercises, especially in the beginning. Exercises may cause you some mild back discomfort which lasts for a few minutes; however, if the pain is more severe, or lasts for more than 15 minutes, do not continue exercises until you see your caregiver. Improvement with exercise therapy for back problems is slow.    See your caregivers for assistance with developing a proper back exercise program.  Document Released: 08/06/2004 Document Revised: 09/21/2011 Document Reviewed: 04/30/2011  ExitCare Patient Information 2013 Marion Downer    [facility]  [address]  [place], [place] [address]  Phone: [phone]  Fax: [phone]               Open Daily 12:00pm - 8:00pm ~ Closed Thanksgiving and Christmas Day     Attending Caregiver: Providence Crosby, MD      Today's orders: No orders of the defined types were placed in this encounter.        If you have been given and antibiotic, we recommend eating yogurt or taking PROBIOTIC pills to decrease the risk of antibiotic induced diarrhea and a resulting infection called C Difficile which can be dangerous/deadly.          Prescription(s) E-Rx to:  [id number] - [place], [place] - [address]    ________________________________________________________________________  Short Term Disability and Family Medical Leave Act  [facility] does NOT provide assistance with any disability applications.  If you feel your medical condition requires you to be on disability, you will need to follow up with  Your primary care physician or a specialist.  We  apologize for any inconvenience.    For Medication Prescribed by [facility]:  As an Urgent Care facility, our clinic does NOT offer prescription refills over the telephone.    If you need more of the medication one of our medical providers prescribed, you will  Either need to be re-evaluated by [place] or see your primary care physician.     ________________________________________________________________________      It is very important that we have a phone number that is the single best way to contact you in the event that we become aware of important clinical information or concerns after your discharge.  If the phone number you provided at registration is NOT this number you should inform staff and registration prior to leaving.      Your treatment and evaluation today was focused on identifying and treating potentially emergent conditions based on your presenting signs, symptoms, and history.  The resulting initial clinical impression and treatment plan is not intended to be definitive or a substitute for a full physical examination and evaluation by your primary care provider.  If your symptoms persist, worsen, or you develop any new or concerning symptoms, you need to be evaluated.      If you received x-rays during your visit, be aware that the final and formal interpretation of those films by a radiologist may occur after your discharge.  If there is a significant discrepancy identified after your discharge, we will contact you at the telephone number provided at registration.      If you received a pelvic exam, you may have cultures pending for sexually transmitted diseases.  Positive cultures are reported to the [facility] Department of Health as required by state law.  You should be contacted if you cultures are positive.  We will not contact you if they are negative.  You did NOT receive a PAP smear (the screening test for cervical).  This specific test for women is best performed by your gynecologist or primary care provider when indicated.       If you are over 76 year old, we cannot discuss your personal health information with a parent, spouse, family member, or anyone else without your express consent.  This does not include those who have legitimate access to your records and information to assist in your care under the provisions of HIPAA [facility] Portability and Accountability Act) law, or those to whom you have previously given express written consent to do so, such a legal guardian or Power of [place].      You may have received medication that may cause you to feel drowsy and/or light headed for several hours.  You may even experience some amnesia of your stay.  You should avoid operating a motor vehicle or performing any activity requiring complete alertness or coordination until you feel fully awake (approximately 24-48 hours).  Avoid alcoholic beverages.  You may also have a dry mouth for several hours.  This is a normal side effect and will disappear as the effects of the medication wear off.      Instructions discussed with patient upon discharge by clinical staff with all questions answered.  Please call  Urgent Care [phone]) if any further questions.  Go immediately to the emergency department if any concern or worsening symptoms.      Providence Crosby, MD 07/22/2012, 3:44 PM

## 2012-07-22 NOTE — Progress Notes (Signed)
BP 127/78   Pulse 100   Temp(Src) 36.7 C (98.1 F) (Oral)   Ht 1.651 m (5\' 5" )   Wt 94.348 kg (208 lb)   BMI 34.61 kg/m2   LMP 07/15/2012   Breastfeeding? No  [name] [name] 07/22/2012, 3:51 PM

## 2012-07-23 ENCOUNTER — Encounter (INDEPENDENT_AMBULATORY_CARE_PROVIDER_SITE_OTHER): Payer: Self-pay

## 2012-08-09 ENCOUNTER — Encounter (INDEPENDENT_AMBULATORY_CARE_PROVIDER_SITE_OTHER): Payer: Self-pay | Admitting: Emergency Medicine

## 2012-08-09 ENCOUNTER — Ambulatory Visit (INDEPENDENT_AMBULATORY_CARE_PROVIDER_SITE_OTHER): Payer: Medicaid Other | Admitting: Emergency Medicine

## 2012-08-09 VITALS — BP 110/80 | HR 82 | Temp 99.4°F | Resp 16 | Ht 65.0 in | Wt 210.0 lb

## 2012-08-09 NOTE — Progress Notes (Signed)
BP 110/80   Pulse 82   Temp(Src) 37.4 C (99.4 F) (Oral)   Resp 16   Ht 1.651 m (5\' 5" )   Wt 95.255 kg (210 lb)   BMI 34.95 kg/m2   LMP 07/15/2012

## 2012-08-10 ENCOUNTER — Encounter (INDEPENDENT_AMBULATORY_CARE_PROVIDER_SITE_OTHER): Payer: Self-pay | Admitting: Emergency Medicine

## 2012-08-16 ENCOUNTER — Encounter (INDEPENDENT_AMBULATORY_CARE_PROVIDER_SITE_OTHER): Payer: Medicaid Other | Admitting: Emergency Medicine

## 2012-09-14 ENCOUNTER — Ambulatory Visit (INDEPENDENT_AMBULATORY_CARE_PROVIDER_SITE_OTHER): Payer: Medicaid Other

## 2012-09-14 ENCOUNTER — Encounter (INDEPENDENT_AMBULATORY_CARE_PROVIDER_SITE_OTHER): Payer: Self-pay

## 2012-09-14 VITALS — BP 135/73 | HR 110 | Temp 98.6°F | Ht 65.0 in | Wt 207.0 lb

## 2012-09-14 MED ORDER — CETIRIZINE 10 MG TABLET
10.0000 mg | ORAL_TABLET | Freq: Every day | ORAL | Status: DC
Start: 2012-09-14 — End: 2012-11-09

## 2012-09-14 MED ORDER — FLUTICASONE PROPIONATE 50 MCG/ACTUATION NASAL SPRAY,SUSPENSION
2.0000 | Freq: Every day | NASAL | Status: DC
Start: 2012-09-14 — End: 2013-09-18

## 2012-09-14 NOTE — Patient Instructions (Addendum)
 Common Cold, Adult  An upper respiratory tract infection, or cold, is a viral infection of the air passages to the lung. Colds are contagious, especially during the first 3 or 4 days. Antibiotics cannot cure a cold. Cold germs are spread by coughs, sneezes, and hand to hand contact. A respiratory tract infection usually clears up in a few days, but some people may be sick for a week or two.  HOME CARE INSTRUCTIONS   Only take over-the-counter or prescription medicines for pain, discomfort, or fever as directed by your caregiver.    Be careful not to blow your nose too hard. This may cause a nosebleed.    Use a cool-mist humidifier (vaporizer) to increase air moisture. This will make it easier for you to breath. Do not use hot steam.    Rest as much as possible and get plenty of sleep.    Wash your hands often, especially after you blow your nose. Cover your mouth and nose with a tissue when you sneeze or cough.    Drink at least 8 glasses of clear liquids every day, such as water, fruit juices, tea, clear soups, and carbonated beverages.   SEEK MEDICAL CARE IF:   An oral temperature above 101 lasts 4 days or more, and is not controlled by medication.    You have a sore throat that gets worse or you see white or yellow spots in your throat.    Your cough gets worse or lasts more than 10 days.    You have a rash somewhere on your skin. You have large and tender lumps in your neck.    You have an earache or a headache.    You have thick, greenish or yellowish discharge from your nose.    You cough-up thick yellow, green, gray or bloody mucus (secretions).   SEEK IMMEDIATE MEDICAL CARE IF:  You have trouble breathing, chest pain, or your skin or nails look gray or blue.  MAKE SURE YOU:    Understand these instructions.    Will watch your condition.    Will get help right away if you are not doing well or get worse.   Document Released: 06/26/2000 Document Re-Released: 06/11/2008  ExitCare Patient  Information 2012 Fidencio BIJOU.    [facility]  [address]  [place], [place] [address]  Phone: [phone]  Fax: [phone]               Open Daily 12:00pm - 8:00pm ~ Closed Thanksgiving and Christmas Day     Attending Caregiver: Willma JONELLE Sora, MD      Today's orders:   Orders Placed This Encounter   . POCT RAPID FLU   . fluticasone  (FLONASE ) 50 mcg/actuation Nasal Spray, Suspension   . cetirizine  (ZYRTEC ) 10 mg Oral Tablet        If you have been given and antibiotic, we recommend eating yogurt or taking PROBIOTIC pills to decrease the risk of antibiotic induced diarrhea and a resulting infection called C Difficile which can be dangerous/deadly.          Prescription(s) E-Rx to:  [id number] - [place], [place] - [address]    ________________________________________________________________________  Short Term Disability and Family Medical Leave Act   Urgent Care does NOT provide assistance with any disability applications.  If you feel your medical condition requires you to be on disability, you will need to follow up with  Your primary care physician or  a specialist.  We apologize for any inconvenience.    For Medication Prescribed by [facility]:  As an Urgent Care facility, our clinic does NOT offer prescription refills over the telephone.    If you need more of the medication one of our medical providers prescribed, you will  Either need to be re-evaluated by us  or see your primary care physician.    ________________________________________________________________________      It is very important that we have a phone number that is the single best way to contact you in the event that we become aware of important clinical information or concerns after your discharge.  If the phone number you provided at registration is NOT this number you should inform staff and registration prior to leaving.      Your treatment and evaluation today was focused on  identifying and treating potentially emergent conditions based on your presenting signs, symptoms, and history.  The resulting initial clinical impression and treatment plan is not intended to be definitive or a substitute for a full physical examination and evaluation by your primary care provider.  If your symptoms persist, worsen, or you develop any new or concerning symptoms, you need to be evaluated.      If you received x-rays during your visit, be aware that the final and formal interpretation of those films by a radiologist may occur after your discharge.  If there is a significant discrepancy identified after your discharge, we will contact you at the telephone number provided at registration.      If you received a pelvic exam, you may have cultures pending for sexually transmitted diseases.  Positive cultures are reported to the [facility] Department of Health as required by state law.  You should be contacted if you cultures are positive.  We will not contact you if they are negative.  You did NOT receive a PAP smear (the screening test for cervical).  This specific test for women is best performed by your gynecologist or primary care provider when indicated.      If you are over 41 year old, we cannot discuss your personal health information with a parent, spouse, family member, or anyone else without your express consent.  This does not include those who have legitimate access to your records and information to assist in your care under the provisions of HIPAA [facility] Portability and Accountability Act) law, or those to whom you have previously given express written consent to do so, such a legal guardian or Power of [place].      You may have received medication that may cause you to feel drowsy and/or light headed for several hours.  You may even experience some amnesia of your stay.  You should avoid operating a motor vehicle or performing any activity requiring complete alertness or coordination  until you feel fully awake (approximately 24-48 hours).  Avoid alcoholic beverages.  You may also have a dry mouth for several hours.  This is a normal side effect and will disappear as the effects of the medication wear off.      Instructions discussed with patient upon discharge by clinical staff with all questions answered.  Please call [facility] [phone]) if any further questions.  Go immediately to the emergency department if any concern or worsening symptoms.      Willma JONELLE Sora, MD 09/14/2012, 8:16 PM

## 2012-09-14 NOTE — Progress Notes (Signed)
BP 135/73   Pulse 110   Temp(Src) 37 C (98.6 F) (Oral)   Ht 1.651 m (5\' 5" )   Wt 93.895 kg (207 lb)   BMI 34.45 kg/m2   LMP 08/31/2012  Bing Quarry 09/14/2012, 7:35 PM

## 2012-09-14 NOTE — Progress Notes (Signed)
09/14/12 1900   Rapid Influ A   Rapid Influenza A Negative   Rapid Influenza Culture Sent  NO   Initials jjc   Lot# [id number]   Expiration Date 04/12/14   Internal Control Valid yes   Rapid Influ B   Rapid Influenza B Negative   Rapid Influenza Culture Sent  NO   Initials jjc   Lot# [id number]   Expiration Date 04/12/14   Internal Control Valid yes

## 2012-09-14 NOTE — Progress Notes (Signed)
 ID: [name] [name]   DOB: [birth year]  Date of Service: 09/14/2012     Chief Complaint(s):   Chief Complaint   Patient presents with   . Headache   . Nasal Congestion   . Sore Throat   . Generalized Body Aches   . Lethargy   . Cough       Subjective:     Sore throat, headache, body aches with lethargy and cough over the past 3 days. They're young daughter is also sick with vomiting and loss of appetite. Patient was concerned she might have influenza and was requesting an influenza test. No nausea vomiting currently and is no skin rash. She has no high-risk influenza comorbidities. No shortness of breath. Tolerating p.o. fluids    Current Outpatient Prescriptions   Medication Sig   . cetirizine  (ZYRTEC ) 10 mg Oral Tablet Take 1 Tab (10 mg total) by mouth Once a day   . cyclobenzaprine  (FLEXERIL ) 5 mg Oral Tablet Take 1 Tab (5 mg total) by mouth Three times a day   . fluticasone  (FLONASE ) 50 mcg/actuation Nasal Spray, Suspension 2 Sprays by Each Nostril route Once a day Drink water after using this medicine   . Ibuprofen  (MOTRIN ) 800 mg Oral Tablet Take 1 Tab (800 mg total) by mouth Three times a day as needed for Pain   . naproxen  sodium (ANAPROX ) 550 mg Oral Tablet Take 1 Tab (550 mg total) by mouth Twice daily with food   . predniSONE  (DELTASONE ) 20 mg Oral Tablet Take 2 Tabs (40 mg total) by mouth Once a day       Allergies   Allergen Reactions   . Imitrex  (Sumatriptan  Succinate) Rash   . Other Rash     Bleach       Family History   Problem Relation Age of Onset   . Adopted: Yes   . Cancer Neg Hx    . Diabetes Neg Hx        Active Ambulatory Problems     Diagnosis Date Noted   . ASCUS on Pap smear for follow-up postpartum 06/05/2009   . Chlamydia infection, current pregnancy, treated 06/05/2009   . Decreased fetal movement 02/05/2012   . Hx MRSA infection 02/19/2012   . Postpartum care following vaginal delivery 02/20/2012     Resolved Ambulatory Problems     Diagnosis Date Noted   . Anemia in pregnancy 06/05/2009   .  Normal pregnancy, first 10/17/2009   . Normal pregnancy, repeat 02/05/2012   . Active labor 02/19/2012     No Additional Past Medical History       Social History        Objective:  Vitals Signs are stable   Filed Vitals:    09/14/12 1934   BP: 135/73   Pulse: 110   Temp: 37 C (98.6 F)   TempSrc: Oral   Height: 1.651 m (5' 5)   Weight: 93.895 kg (207 lb)       Gen:  no acute distress, non toxic looking, interactive  HEENT:  TMs are normal, oropharynx is without exudate or erythema, the neck is supple with no adenopathy  Heart:  Regular rate and rhythm without murmur  Lungs:  Clear to auscultation bilaterally no wheeze  Skin:  No obvious rash  Rapid flu negative  Assessment:          1. Headache (784.0)  POCT RAPID FLU   2. Cough (786.2)  POCT RAPID FLU   3. Body aches (780.96)  POCT RAPID FLU   4. Fatigue (780.79)  POCT RAPID FLU   5. Sore throat (462)  POCT RAPID FLU   6. URI (upper respiratory infection) (465.9)         Orders Placed This Encounter   . POCT RAPID FLU   . fluticasone  (FLONASE ) 50 mcg/actuation Nasal Spray, Suspension   . cetirizine  (ZYRTEC ) 10 mg Oral Tablet         Plan:   1)   Most likely a viral illness and this has been explained.  At this time I recommend symptomatic care, and Tylenol  Motrin , push p.o. fluids, not to worry if appetite drops off as long as taking good fluids.   Patient given Flonase  to help open up the head and aid with proper drainage. Zyrtec  prescribed to help with drying up of body secretions.  Use for approx 10 days and then dc.  Given with warnings.      Follow up with us  immediately if patient worsens or status changes significantly otherwise it should run benign course.     All questions were answered and patient agrees to plan  Level III

## 2012-09-15 ENCOUNTER — Encounter (INDEPENDENT_AMBULATORY_CARE_PROVIDER_SITE_OTHER): Payer: Self-pay

## 2012-09-15 NOTE — Progress Notes (Signed)
Pt seen at urgent care yesterday. Left message to call back with any questions or concerns.

## 2012-11-09 ENCOUNTER — Other Ambulatory Visit (INDEPENDENT_AMBULATORY_CARE_PROVIDER_SITE_OTHER): Payer: Self-pay

## 2013-05-07 ENCOUNTER — Other Ambulatory Visit: Payer: Self-pay

## 2013-09-18 ENCOUNTER — Emergency Department
Admission: EM | Admit: 2013-09-18 | Discharge: 2013-09-18 | Disposition: A | Discharge status: Home / Self Care | Payer: Medicaid Other | Attending: Emergency Medicine | Admitting: Emergency Medicine

## 2013-09-18 DIAGNOSIS — Y92410 Unspecified street and highway as the place of occurrence of the external cause: Secondary | ICD-10-CM | POA: Insufficient documentation

## 2013-09-18 DIAGNOSIS — S39012A Strain of muscle, fascia and tendon of lower back, initial encounter: Secondary | ICD-10-CM

## 2013-09-18 DIAGNOSIS — IMO0002 Reserved for concepts with insufficient information to code with codable children: Secondary | ICD-10-CM | POA: Insufficient documentation

## 2013-09-18 DIAGNOSIS — S139XXA Sprain of joints and ligaments of unspecified parts of neck, initial encounter: Secondary | ICD-10-CM | POA: Insufficient documentation

## 2013-09-18 DIAGNOSIS — S161XXA Strain of muscle, fascia and tendon at neck level, initial encounter: Secondary | ICD-10-CM

## 2013-09-18 MED ORDER — IBUPROFEN 600 MG TABLET
600.00 mg | ORAL_TABLET | Freq: Three times a day (TID) | ORAL | Status: DC | PRN
Start: 2013-09-18 — End: 2013-12-22

## 2013-09-18 MED ORDER — CYCLOBENZAPRINE 10 MG TABLET
10.00 mg | ORAL_TABLET | Freq: Three times a day (TID) | ORAL | Status: DC
Start: 2013-09-18 — End: 2013-12-22
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
## 2013-09-18 NOTE — ED Nurses Note (Signed)
Bedside rounding complete. No needs voiced at this time. Awaiting provider to see.

## 2013-09-18 NOTE — ED Nurses Note (Signed)
Patient discharged home with family.  AVS reviewed with patient/care giver.  A written copy of the AVS and discharge instructions was given to the patient/care giver.  Questions sufficiently answered as needed.  Patient/care giver encouraged to follow up with PCP as indicated.  In the event of an emergency, patient/care giver instructed to call 911 or go to the nearest emergency room.     2 scripts
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
## 2013-09-18 NOTE — ED Nurses Note (Signed)
Pt gowned in room awaiting provider family at bedside.

## 2013-09-19 ENCOUNTER — Ambulatory Visit (INDEPENDENT_AMBULATORY_CARE_PROVIDER_SITE_OTHER): Payer: Medicaid Other

## 2013-09-19 ENCOUNTER — Other Ambulatory Visit (INDEPENDENT_AMBULATORY_CARE_PROVIDER_SITE_OTHER): Payer: Self-pay

## 2013-09-19 ENCOUNTER — Encounter (INDEPENDENT_AMBULATORY_CARE_PROVIDER_SITE_OTHER): Payer: Self-pay

## 2013-09-19 DIAGNOSIS — IMO0001 Reserved for inherently not codable concepts without codable children: Secondary | ICD-10-CM

## 2013-09-19 DIAGNOSIS — M791 Myalgia, unspecified site: Secondary | ICD-10-CM
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
## 2013-09-19 NOTE — Telephone Encounter (Signed)
Refill placed, addressed at today's visit.

## 2013-09-19 NOTE — Progress Notes (Signed)
BP 112/70   Pulse 104   Temp(Src) 37.4 C (99.3 F) (Oral)   Resp 18   Ht 1.689 m (5' 6.5")   Wt 92.534 kg (204 lb)   BMI 32.44 kg/m2  Madisson Kulaga L. Dimitry Holsworth, LPN  1/61/09603/04/2014, 13:19

## 2013-09-27 ENCOUNTER — Telehealth (INDEPENDENT_AMBULATORY_CARE_PROVIDER_SITE_OTHER): Payer: Self-pay

## 2013-09-27 DIAGNOSIS — M549 Dorsalgia, unspecified: Secondary | ICD-10-CM

## 2013-09-27 NOTE — Telephone Encounter (Signed)
Referral placed for physical therapy, please let the patient know so that she can contact insurance and get it cleared before starting physical therapy. Thank you.
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
## 2013-09-28 NOTE — Telephone Encounter (Signed)
Patient advised. Verbalized understanding 

## 2013-09-29 ENCOUNTER — Encounter (INDEPENDENT_AMBULATORY_CARE_PROVIDER_SITE_OTHER): Payer: Self-pay | Admitting: Family Medicine

## 2013-10-02 ENCOUNTER — Ambulatory Visit (INDEPENDENT_AMBULATORY_CARE_PROVIDER_SITE_OTHER): Payer: Medicaid Other

## 2013-10-02 DIAGNOSIS — IMO0001 Reserved for inherently not codable concepts without codable children: Secondary | ICD-10-CM

## 2013-10-02 DIAGNOSIS — Z3049 Encounter for surveillance of other contraceptives: Secondary | ICD-10-CM

## 2013-10-02 NOTE — Progress Notes (Signed)
10/02/13 1400   Medication Administration   Initials rw   Medication Name Depo Provera   150mg     NDC#[address]-01   Medication Dose 1ml   Route of Administration IM   Site Right Deltoid   NDC # [id number]   LOT # [id number]   Expiration date 03/12/16   [facility]   Clinic Supplied No   Patient Supplied Yes   Comments: Next injection due June 8-22,2015. Reminder card given.RW

## 2013-10-02 NOTE — Progress Notes (Signed)
10/02/13 1400   Urine   Urine HCG Negative   Initials rw

## 2013-10-03 MED ORDER — MEDROXYPROGESTERONE 150 MG/ML INTRAMUSCULAR SUSPENSION
150.00 mg | INTRAMUSCULAR | Status: DC
Start: 2013-10-02 — End: 2013-12-22

## 2013-11-13 ENCOUNTER — Encounter (INDEPENDENT_AMBULATORY_CARE_PROVIDER_SITE_OTHER): Payer: Self-pay

## 2013-12-22 ENCOUNTER — Encounter (INDEPENDENT_AMBULATORY_CARE_PROVIDER_SITE_OTHER): Payer: Self-pay | Admitting: Family

## 2013-12-22 ENCOUNTER — Ambulatory Visit (INDEPENDENT_AMBULATORY_CARE_PROVIDER_SITE_OTHER): Payer: Medicaid Other | Admitting: Family

## 2013-12-22 VITALS — BP 126/86 | HR 74 | Temp 98.9°F | Resp 16 | Ht 65.0 in | Wt 194.0 lb

## 2013-12-22 DIAGNOSIS — R234 Changes in skin texture: Secondary | ICD-10-CM

## 2013-12-22 DIAGNOSIS — M545 Low back pain, unspecified: Secondary | ICD-10-CM

## 2013-12-22 DIAGNOSIS — L988 Other specified disorders of the skin and subcutaneous tissue: Secondary | ICD-10-CM

## 2013-12-22 MED ORDER — TRIAMCINOLONE ACETONIDE 0.1 % TOPICAL CREAM
TOPICAL_CREAM | Freq: Two times a day (BID) | CUTANEOUS | Status: DC
Start: 2013-12-22 — End: 2014-03-08
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
## 2013-12-22 NOTE — Progress Notes (Signed)
BP 126/86    Pulse 74    Temp(Src) 37.2 C (98.9 F) (Oral)    Resp 16    Ht 1.651 m (5\' 5" )    Wt 87.998 kg (194 lb)    BMI 32.28 kg/m2      LMP 12/12/2013     Earley FavorKenzie M Gautam Langhorst, MA  12/22/2013, 11:26

## 2013-12-28 ENCOUNTER — Encounter (INDEPENDENT_AMBULATORY_CARE_PROVIDER_SITE_OTHER): Payer: Self-pay

## 2014-01-17 ENCOUNTER — Other Ambulatory Visit (HOSPITAL_BASED_OUTPATIENT_CLINIC_OR_DEPARTMENT_OTHER): Payer: Self-pay | Admitting: Family

## 2014-01-17 DIAGNOSIS — M545 Low back pain, unspecified: Secondary | ICD-10-CM

## 2014-01-23 ENCOUNTER — Ambulatory Visit (HOSPITAL_BASED_OUTPATIENT_CLINIC_OR_DEPARTMENT_OTHER)
Admission: RE | Admit: 2014-01-23 | Discharge: 2014-01-23 | Disposition: A | Discharge status: Still In Hospital | Payer: Medicaid Other | Source: Ambulatory Visit | Attending: Family | Admitting: Family

## 2014-01-23 DIAGNOSIS — M545 Low back pain, unspecified: Secondary | ICD-10-CM | POA: Insufficient documentation

## 2014-01-23 DIAGNOSIS — IMO0001 Reserved for inherently not codable concepts without codable children: Secondary | ICD-10-CM | POA: Insufficient documentation

## 2014-01-23 NOTE — PT Treatment (Signed)
[facility]     [facility]  [address]   [address]  [place],  [place]  [id number]  ([phone]) [phone]   (Fax) [phone]    Physical Therapy Treatment Note      Patient Name:  [name] MoodSarah [name]  DOB:  02/16/1991    Visit:   1/10  Date of Surgery:     Next appointment with physician:       Subjective:     Patient reported that PSR=7/10 before and after treatment session.  She felt that following the ESU her low back was more "stingy".  "The original pain on the right side went away, but it hurts on the left now."  Objective:     Therex/HEP as follows:   x25 min,   x10 reps  Hooklying lumbar rotation  B SKTC  B piriformis stretch pulling knee toward opposite shoulder  B piriformis stretch with ankle crossed over opposite knee then pulling knee toward chest  B supine HS stretch  Pelvic tilt--add  Pelvic tilt with marching--add  Bridging --add  Bridging with knee extensions--add  Prone heel press--add  Prone LE extension--add   Prone sacral mobs:  P/A,  Grade ii--add  **Patient given written copy of new ex's with pictures.    MH + IFC ESU to L-spine x15 min in hooklying:   **HOLD next session.  80-150 Hz,  40% scan,  11.2 mA  Patient given call bell  Skin inspection prior to and following treatment session:  Normal  Assessment:     She demonstrated all ex's appropriately with minimal c/o stretching--no pain.  She reported feeling "stingy" following IFC ESU; therefore, this will be held next visit and focus placed on ROM/flexibility and strengthening.   Discussed importance of compliance with HEP.   She would benefit from further PT to address ROM/flexibility and strength deficits along with pain complaints for increased ease of ADL's and overall functional mobility.    Plan:     Con't with POC 2x/week x10 visits to address above noted deficits for increased ease of ADL's.      Total Timed Treatment Minutes:    25  Total Treatment Time:    65 (eval, therex, IFC ESU)
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
## 2014-01-30 ENCOUNTER — Ambulatory Visit (HOSPITAL_BASED_OUTPATIENT_CLINIC_OR_DEPARTMENT_OTHER)
Admission: RE | Admit: 2014-01-30 | Discharge: 2014-01-30 | Disposition: A | Discharge status: Still In Hospital | Payer: Medicaid Other | Source: Ambulatory Visit | Attending: Family | Admitting: Family

## 2014-01-30 NOTE — PT Treatment (Signed)
[facility]     [facility]  [address]   [address]  [place],  [place]  [id number]  ([phone]) [phone]   (Fax) [phone]    Physical Therapy Treatment Note      Patient Name:  [name] MoodSarah [name]  DOB:  04/26/1991    Visit:   2/10  Date of Surgery:     Next appointment with physician:   Not presently noted    Subjective:     Pt presents to therapy reporting that she continues to have sharp pains that come and go. She reports that these pains can be on both sides or unilateral sides. She states that she has been doing her HEP daily. She notes that [facility] and piriformis stretches have been relieving her of some of her pain when she is in the flexed position but when she moves back down into extension the pain returns. Her current PSR of her low back is a 5-6/10. Pt reports that she has been having trouble lifting her children.   Objective:     Therex/HEP as follows:   X 55 min,   x10 reps  Hooklying lumbar rotation  B SKTC  B piriformis stretch pulling knee toward opposite shoulder  B piriformis stretch with ankle crossed over opposite knee then pulling knee toward chest  B supine HS stretch  Pelvic tilt  Pelvic tilt with marching  Bridging  Bridging with knee extensions  Prone heel press (HOLD next appt)  Prone LE extension x5 (discontinued after rep 5 due to increased pain)  Prone sacral mobs:  P/A,  Grade ii--add (attempted x 3 bouts, increased pain left low back, discontinued)      Assessment:     Pt reports to therapy stating low back PSR of 5-6/10. Several new exercises were added today, pt was able to perform all appropriately but she did experience increased pain with prone LE extensions and prone sacral mobs, both were discontinued. During prone LE extensions pt experienced increased pain from lower back that radiated up to just below her scapula. During prone sacral mobs pt experienced increased pain just lateral to S1. Prone exercises should be held until primary therapist can assess.  She would benefit from further PT to address ROM/flexibility and strength deficits along with pain complaints for increased ease of ADL's and overall functional mobility.    Plan:     Con't with POC 2x/week x10 visits to address above noted deficits for increased ease of ADL's.      Total Timed Treatment Minutes:  55  Total Treatment Time:  55 (therex)

## 2014-02-01 ENCOUNTER — Ambulatory Visit (HOSPITAL_BASED_OUTPATIENT_CLINIC_OR_DEPARTMENT_OTHER)
Admission: RE | Admit: 2014-02-01 | Discharge: 2014-02-01 | Disposition: A | Discharge status: Still In Hospital | Payer: Medicaid Other | Source: Ambulatory Visit | Attending: Family | Admitting: Family

## 2014-02-01 NOTE — PT Treatment (Signed)
[facility]     [facility]  [address]   [address]  [place],  [place]  [id number]  ([phone]) [phone]   (Fax) [phone]    Physical Therapy Treatment Note      Patient Name:  [name] [name]  DOB:  02/23/1991    Visit:   3/10  Date of Surgery:     Next appointment with physician:   Not presently noted    Subjective:   Pt presents to therapy reporting a PSR of 3-4/10. She reports that she has noticed a decrease in her pain since her last appointment. She reports that she has been doing her HEP daily.  Objective:     Therex/HEP as follows:   X 30 min,   x10 reps  Hooklying lumbar rotation  B SKTC  B piriformis stretch pulling knee toward opposite shoulder  B piriformis stretch with ankle crossed over opposite knee then pulling knee toward chest  B supine HS stretch  Pelvic tilt  Pelvic tilt with marching  Bridging  Bridging with knee extensions  Prone heel press  Instruction and demonstration TrA   Prone LE extension x5 (HELD)  Prone sacral mobs:  P/A,  Grade ii--add (HELD)      Assessment:  Pt presents to therapy reporting decreased PSR since her last appointment. TrA were introduced today, pt was able to perform without difficulty in both supine and standing. Pt was instructed to do this exercise with all HEP exercises and to practice several times throughout the day. Prone LE extension and prone sacral mobs were not performed today due to increased PSR when they were performed at her last visit. Pt demonstrates slight hip rotation during bridging but is able to self correct when given verbal cues. She would benefit from further PT to address ROM/flexibility and strength deficits along with pain complaints for increased ease of ADL's and overall functional mobility.    Plan:     Con't with POC 2x/week x10 visits to address above noted deficits for increased ease of ADL's.      Total Timed Treatment Minutes:  30  Total Treatment Time:  30 (therex)

## 2014-02-06 ENCOUNTER — Ambulatory Visit (HOSPITAL_BASED_OUTPATIENT_CLINIC_OR_DEPARTMENT_OTHER)
Admission: RE | Admit: 2014-02-06 | Discharge: 2014-02-06 | Disposition: A | Discharge status: Still In Hospital | Payer: Medicaid Other | Source: Ambulatory Visit | Attending: Family | Admitting: Family

## 2014-02-06 NOTE — PT Treatment (Signed)
[facility]     [facility]  [address]   [address]  [place],  [place]  [id number]  ([phone]) [phone]   (Fax) [phone]    Physical Therapy Treatment Note      Patient Name:  [name] MoodSarah [name]  DOB:  04/27/1991    Visit:   4/10  Next appointment with physician:   Not presently noted    Subjective:     PSR: 4-5/10.   "I went to bed in pain yesterday and I woke up in pain this morning.   I have some leg pain and muscle twitching in BLE's occasionally."  "The ex's on my stomach are bothering me."  Objective:     Therex/HEP as follows:   X 20 min,   x5-10 reps  Hooklying lumbar rotation  B SKTC  B piriformis stretch pulling knee toward opposite shoulder  B piriformis stretch with ankle crossed over opposite knee then pulling knee toward chest  B supine HS stretch   Pelvic tilt  Pelvic tilt with marching  Bridging   Bridging with knee extensions  Prone heel press--HOLD  Instruction and demonstration TrA ---HOLD  Prone LE extension x5 (HELD)  Prone sacral mobs:  P/A,  Grade ii--add (HELD)   Assessment:    Pt presents to therapy reporting PSR=4-5/10.  Prone ex's held today due to reports of this position increasing her pain complaints.  She is to hold these at home also.  She reported some radicular symptoms; therefore, lumbar traction performed today to assess effectiveness in decreasing pain and radicular complaints.   She tolerated traction fairly well while she was lying supine; however, upon completion and standing after traction session she felt worse.  She had pain at right buttock.  She was encouraged to contact MD due to lack of progress noted with therex, traction and MH/ESU that was performed for complaints.   She would benefit from further PT to address ROM/flexibility and strength deficits along with pain complaints for increased ease of ADL's and overall functional mobility.    Plan:     Con't with POC 2x/week x10 visits to address above noted deficits for increased ease of  ADL's.      Total Timed Treatment Minutes:  20  Total Treatment Time:  35 (therex and traction )

## 2014-02-08 ENCOUNTER — Ambulatory Visit (HOSPITAL_BASED_OUTPATIENT_CLINIC_OR_DEPARTMENT_OTHER): Admission: RE | Admit: 2014-02-08 | Payer: Medicaid Other | Source: Ambulatory Visit

## 2014-02-16 ENCOUNTER — Encounter (INDEPENDENT_AMBULATORY_CARE_PROVIDER_SITE_OTHER): Payer: Medicaid Other

## 2014-03-08 ENCOUNTER — Ambulatory Visit (INDEPENDENT_AMBULATORY_CARE_PROVIDER_SITE_OTHER): Payer: Medicaid Other

## 2014-03-08 ENCOUNTER — Encounter (INDEPENDENT_AMBULATORY_CARE_PROVIDER_SITE_OTHER): Payer: Self-pay

## 2014-03-08 VITALS — BP 116/64 | HR 68 | Temp 100.2°F | Resp 16 | Ht 67.0 in | Wt 198.0 lb

## 2014-03-08 DIAGNOSIS — M545 Low back pain, unspecified: Secondary | ICD-10-CM

## 2014-03-08 DIAGNOSIS — Z6831 Body mass index (BMI) 31.0-31.9, adult: Secondary | ICD-10-CM

## 2014-03-08 DIAGNOSIS — M5430 Sciatica, unspecified side: Secondary | ICD-10-CM

## 2014-03-08 DIAGNOSIS — L301 Dyshidrosis [pompholyx]: Secondary | ICD-10-CM

## 2014-03-08 MED ORDER — TRIAMCINOLONE ACETONIDE 0.1 % TOPICAL OINTMENT
TOPICAL_OINTMENT | Freq: Two times a day (BID) | CUTANEOUS | Status: DC
Start: 2014-03-08 — End: 2014-04-02

## 2014-03-08 MED ORDER — GABAPENTIN 100 MG CAPSULE
100.00 mg | ORAL_CAPSULE | Freq: Three times a day (TID) | ORAL | Status: DC
Start: 2014-03-08 — End: 2014-03-22

## 2014-03-08 NOTE — Progress Notes (Signed)
BP 116/64 mmHg   Pulse 68   Temp(Src) 37.9 C (100.2 F) (Oral)   Resp 16   Ht 1.702 m ( )   Wt 89.812 kg (198 lb)   BMI 31.00 kg/m2   LMP 02/22/2014  Cigi Bega L. Amelia Macken, LPN  1/61/0960, 09:58

## 2014-03-14 ENCOUNTER — Telehealth (INDEPENDENT_AMBULATORY_CARE_PROVIDER_SITE_OTHER): Payer: Self-pay

## 2014-03-14 NOTE — Telephone Encounter (Signed)
Patient reports having an allergic reaction to the Neurontin, itching and swelling of the face, denies any labored breathing.  Discontinued Monday morning, can you prescribe something else, please advise

## 2014-03-15 ENCOUNTER — Other Ambulatory Visit (INDEPENDENT_AMBULATORY_CARE_PROVIDER_SITE_OTHER): Payer: Self-pay

## 2014-03-15 ENCOUNTER — Ambulatory Visit
Admission: RE | Admit: 2014-03-15 | Discharge: 2014-03-15 | Disposition: A | Discharge status: Home / Self Care | Payer: Medicaid Other | Source: Ambulatory Visit | Attending: Family Medicine | Admitting: Family Medicine

## 2014-03-15 DIAGNOSIS — M545 Low back pain, unspecified: Secondary | ICD-10-CM | POA: Insufficient documentation

## 2014-03-15 DIAGNOSIS — M5430 Sciatica, unspecified side: Secondary | ICD-10-CM

## 2014-03-21 NOTE — Telephone Encounter (Signed)
Did patient have any relief with Neurontin? If so, we can prescribe a different medication that works in a similar way, otherwise no other medication will really help the patient the same way Neurontin does and exercises would be the best long term treatment.     Loyal Jacobson, MD  03/21/2014, 05:53

## 2014-03-21 NOTE — Telephone Encounter (Signed)
Patient states that the Gabapentin did help her symptoms and would like to try another medication as she is still experiencing back pain.  X-Ray results were also given over the phone.  Will try new medication and call the clinic if symptoms persist.

## 2014-03-22 MED ORDER — PREGABALIN 50 MG CAPSULE
50.00 mg | ORAL_CAPSULE | Freq: Two times a day (BID) | ORAL | Status: DC
Start: 2014-03-22 — End: 2016-11-12

## 2014-03-22 NOTE — Telephone Encounter (Signed)
Rx printed to resident room for Lyrica, can do short trial to see if it may work and if she has any adverse effects. Please let her know most common side effects of edema, weight gain, and feeling tired with it. Have her set up for follow up appointment to discuss how medication is working and other possible treatments.     Loyal Jacobson, MD  03/22/2014, 05:56

## 2014-03-22 NOTE — Telephone Encounter (Signed)
Patient made aware that new script was sent in to pharmacy.  Discussed possible side effects.  Follow-up appointment scheduled for 04/05/14.  Patient voiced understanding.

## 2014-04-02 ENCOUNTER — Encounter (INDEPENDENT_AMBULATORY_CARE_PROVIDER_SITE_OTHER): Payer: Self-pay | Admitting: Family Medicine

## 2014-04-02 ENCOUNTER — Ambulatory Visit (INDEPENDENT_AMBULATORY_CARE_PROVIDER_SITE_OTHER): Payer: Medicaid Other | Admitting: Family Medicine

## 2014-04-02 VITALS — BP 110/80 | HR 96 | Temp 98.3°F | Resp 16 | Wt 198.3 lb

## 2014-04-02 DIAGNOSIS — Z6831 Body mass index (BMI) 31.0-31.9, adult: Secondary | ICD-10-CM

## 2014-04-02 DIAGNOSIS — J029 Acute pharyngitis, unspecified: Secondary | ICD-10-CM

## 2014-04-02 DIAGNOSIS — J069 Acute upper respiratory infection, unspecified: Secondary | ICD-10-CM

## 2014-04-02 NOTE — Patient Instructions (Addendum)
[facility]  [address]  [place], [place] [address]  Phone: [phone])  Fax: [phone]               Open Mon - Sat 8:00am - 8:00pm [place] Crocker- 8:00pm                                                              ~ Closed Thanksgiving and Christmas Day     Attending Caregiver: Revonda Humphrey, MD      Today's orders:   Orders Placed This Encounter    POCT RAPID STREP A        Prescription(s) E-Rx to:  [id number] - [place], [place] - [address]    ________________________________________________________________________  Short Term Disability and Family Medical Leave Act   Urgent Care does NOT provide assistance with any disability applications.  If you feel your medical condition requires you to be on disability, you will need to follow up with  Your primary care physician or a specialist.  We apologize for any inconvenience.    For Medication Prescribed by [facility]:  As an Urgent Care facility, our clinic does NOT offer prescription refills over the telephone.    If you need more of the medication one of our medical providers prescribed, you will  Either need to be re-evaluated by [place] or see your primary care physician.    ________________________________________________________________________      It is very important that we have a phone number that is the single best way to contact you in the event that we become aware of important clinical information or concerns after your discharge.  If the phone number you provided at registration is NOT this number you should inform staff and registration prior to leaving.      Your treatment and evaluation today was focused on identifying and treating potentially emergent conditions based on your presenting signs, symptoms, and history.  The resulting initial clinical impression and treatment plan is not intended to be definitive or a substitute for a full physical examination and evaluation by your primary care provider.   If your symptoms persist, worsen, or you develop any new or concerning symptoms, you need to be evaluated.      If you received x-rays during your visit, be aware that the final and formal interpretation of those films by a radiologist may occur after your discharge.  If there is a significant discrepancy identified after your discharge, we will contact you at the telephone number provided at registration.      If you received a pelvic exam, you may have cultures pending for sexually transmitted diseases.  Positive cultures are reported to the [facility] Department of Health as required by state law.  You should be contacted if you cultures are positive.  We will not contact you if they are negative.  You did NOT receive a PAP smear (the screening test for cervical).  This specific test for women is best performed by your gynecologist or primary care provider when indicated.      If you are over 18 year old, we cannot discuss your personal health information with a parent, spouse, family member, or anyone else without your express consent.  This does not include those  who have legitimate access to your records and information to assist in your care under the provisions of HIPAA [facility] Portability and Accountability Act) law, or those to whom you have previously given express written consent to do so, such a legal guardian or Power of [place].      You may have received medication that may cause you to feel drowsy and/or light headed for several hours.  You may even experience some amnesia of your stay.  You should avoid operating a motor vehicle or performing any activity requiring complete alertness or coordination until you feel fully awake (approximately 24-48 hours).  Avoid alcoholic beverages.  You may also have a dry mouth for several hours.  This is a normal side effect and will disappear as the effects of the medication wear off.      Instructions discussed with patient upon discharge by clinical staff  with all questions answered.  Please call [facility] [phone]) if any further questions.  Go immediately to the emergency department if any concern or worsening symptoms.      Revonda Humphrey, MD 04/02/2014, 11:23      Upper Respiratory Infection, Adult  An upper respiratory infection (URI) is also sometimes known as the common cold. The upper respiratory tract includes the nose, sinuses, throat, trachea, and bronchi. Bronchi are the airways leading to the lungs. Most people improve within 1 week, but symptoms can last up to 2 weeks. A residual cough may last even longer.   CAUSES  Many different viruses can infect the tissues lining the upper respiratory tract. The tissues become irritated and inflamed and often become very moist. Mucus production is also common. A cold is contagious. You can easily spread the virus to others by oral contact. This includes kissing, sharing a glass, coughing, or sneezing. Touching your mouth or nose and then touching a surface, which is then touched by another person, can also spread the virus.  SYMPTOMS   Symptoms typically develop 1 to 3 days after you come in contact with a cold virus. Symptoms vary from person to person. They may include:   Runny nose.   Sneezing.   Nasal congestion.   Sinus irritation.   Sore throat.   Loss of voice (laryngitis).   Cough.   Fatigue.   Muscle aches.   Loss of appetite.   Headache.   Low-grade fever.  DIAGNOSIS   You might diagnose your own cold based on familiar symptoms, since most people get a cold 2 to 3 times a year. Your caregiver can confirm this based on your exam. Most importantly, your caregiver can check that your symptoms are not due to another disease such as strep throat, sinusitis, pneumonia, asthma, or epiglottitis. Blood tests, throat tests, and X-rays are not necessary to diagnose a common cold, but they may sometimes be helpful in excluding other more serious diseases. Your caregiver will decide if any further  tests are required.  RISKS AND COMPLICATIONS   You may be at risk for a more severe case of the common cold if you smoke cigarettes, have chronic heart disease (such as heart failure) or lung disease (such as asthma), or if you have a weakened immune system. The very young and very old are also at risk for more serious infections. Bacterial sinusitis, middle ear infections, and bacterial pneumonia can complicate the common cold. The common cold can worsen asthma and chronic obstructive pulmonary disease (COPD). Sometimes, these complications can require emergency medical care and  may be life-threatening.  PREVENTION   The best way to protect against getting a cold is to practice good hygiene. Avoid oral or hand contact with people with cold symptoms. Wash your hands often if contact occurs. There is no clear evidence that vitamin C, vitamin E, echinacea, or exercise reduces the chance of developing a cold. However, it is always recommended to get plenty of rest and practice good nutrition.  TREATMENT   Treatment is directed at relieving symptoms. There is no cure. Antibiotics are not effective, because the infection is caused by a virus, not by bacteria. Treatment may include:   Increased fluid intake. Sports drinks offer valuable electrolytes, sugars, and fluids.   Breathing heated mist or steam (vaporizer or shower).   Eating chicken soup or other clear broths, and maintaining good nutrition.   Getting plenty of rest.   Using gargles or lozenges for comfort.   Controlling fevers with ibuprofen or acetaminophen as directed by your caregiver.   Increasing usage of your inhaler if you have asthma.  Zinc gel and zinc lozenges, taken in the first 24 hours of the common cold, can shorten the duration and lessen the severity of symptoms. Pain medicines may help with fever, muscle aches, and throat pain. A variety of non-prescription medicines are available to treat congestion and runny nose. Your caregiver can  make recommendations and may suggest nasal or lung inhalers for other symptoms.   HOME CARE INSTRUCTIONS    Only take over-the-counter or prescription medicines for pain, discomfort, or fever as directed by your caregiver.   Use a warm mist humidifier or inhale steam from a shower to increase air moisture. This may keep secretions moist and make it easier to breathe.   Drink enough water and fluids to keep your urine clear or pale yellow.   Rest as needed.   Return to work when your temperature has returned to normal or as your caregiver advises. You may need to stay home longer to avoid infecting others. You can also use a face mask and careful hand washing to prevent spread of the virus.  SEEK MEDICAL CARE IF:    After the first few days, you feel you are getting worse rather than better.   You need your caregiver's advice about medicines to control symptoms.   You develop chills, worsening shortness of breath, or brown or red sputum. These may be signs of pneumonia.   You develop yellow or brown nasal discharge or pain in the face, especially when you bend forward. These may be signs of sinusitis.   You develop a fever, swollen neck glands, pain with swallowing, or white areas in the back of your throat. These may be signs of strep throat.  SEEK IMMEDIATE MEDICAL CARE IF:    You have a fever.   You develop severe or persistent headache, ear pain, sinus pain, or chest pain.   You develop wheezing, a prolonged cough, cough up blood, or have a change in your usual mucus (if you have chronic lung disease).   You develop sore muscles or a stiff neck.  Document Released: 12/23/2000 Document Revised: 09/21/2011 Document Reviewed: 10/04/2013  [facility] Patient Information 2015 [place], [place]. This information is not intended to replace advice given to you by your health care provider. Make sure you discuss any questions you have with your health care provider.

## 2014-04-02 NOTE — Progress Notes (Signed)
Subjective:     Patient ID:  [name] [name] is an 23 y.o. female   Chief Complaint:    Chief Complaint   Patient presents with    Sore Throat    Enlarged Tonsils    Ear Pain     right ear     HPI  URGENT CARE VISIT: This 23yo woman complains of odynophagia, rhinorrhea, and right otalgia times 1 week.  This is joined by right-sided facial swelling that is new in the past day.  Tried Tylenol PM Cold, good relief.  Patient's children were sick with "colds" last week.    Review of Systems   Pertinent positives embedded in HPI above.  No fever.  No vomiting, no diarrhea, no constipation, no appetite disturbance.    Objective:  BP 110/80 mmHg   Pulse 96   Temp(Src) 36.8 C (98.3 F) (Oral)   Resp 16   Wt 89.948 kg (198 lb 4.8 oz)   LMP 02/22/2014   Physical Exam   Constitutional: She is oriented to person, place, and time. She appears well-developed and well-nourished. No distress.   HENT:   Head: Normocephalic and atraumatic.   Right Ear: External ear normal.   Mouth/Throat: No oropharyngeal exudate.   Tonsils enlarged, no exudate.  Clear effusion seen behind right TM, no erythema, no bulging.  Left TM normal.  Clear discharge from nares.   Eyes: Pupils are equal, round, and reactive to light.   Cardiovascular: Normal rate, regular rhythm and normal heart sounds.    Pulmonary/Chest: Effort normal and breath sounds normal. No respiratory distress.   Lymphadenopathy:     She has cervical adenopathy.   Neurological: She is alert and oriented to person, place, and time.   Skin: Skin is warm and dry. She is not diaphoretic.   Psychiatric: She has a normal mood and affect. Her behavior is normal. Judgment and thought content normal.   Nursing note and vitals reviewed.    Ortho Exam  POCT Strep negative.    Assessment & Plan:   Upper respiratory infection, presumed viral.    1. Counseled on pathophysiology and natural course.  2. Advised continuing Tylenol PM Cold, if desired.  3. Offered Rx for nasal decongestant, patient  declined.  4. Advised mask-wearing and hand-washing to minimize transmission risk.    Rochele Raring. Richarda Osmond, MD.

## 2014-04-02 NOTE — Progress Notes (Signed)
04/02/14 1000   Rapid Strep   Rapid Strep Negative   Lot# [id number]   Expiration Date 08/13/15   Internal Control Valid yes   Nurse initials ant   Ordering Physician koperda

## 2014-04-02 NOTE — Progress Notes (Signed)
BP 110/80 mmHg   Pulse 96   Temp(Src) 36.8 C (98.3 F) (Oral)   Resp 16   Wt 89.948 kg (198 lb 4.8 oz)   LMP 02/22/2014  Quin Hoop, RN  04/02/2014, 10:24

## 2014-04-03 ENCOUNTER — Encounter (INDEPENDENT_AMBULATORY_CARE_PROVIDER_SITE_OTHER): Payer: Self-pay

## 2014-04-03 NOTE — Progress Notes (Signed)
Spoke with patient concerning yesterdays visit at [facility] in [place]. Patient has improved. Advised patient to call back with any questions or concerns.   Vic Blackbird, RT (R)  04/03/2014, 10:04

## 2014-04-04 ENCOUNTER — Telehealth (INDEPENDENT_AMBULATORY_CARE_PROVIDER_SITE_OTHER): Payer: Self-pay

## 2014-04-04 NOTE — Telephone Encounter (Signed)
Pa form faxed to [facility] for lyrica. Pending response.

## 2014-04-05 ENCOUNTER — Telehealth (INDEPENDENT_AMBULATORY_CARE_PROVIDER_SITE_OTHER): Payer: Self-pay

## 2014-04-05 ENCOUNTER — Encounter (INDEPENDENT_AMBULATORY_CARE_PROVIDER_SITE_OTHER): Payer: Medicaid Other

## 2014-04-05 NOTE — Telephone Encounter (Signed)
Pharm called concerning the Lyrica, it is pulling a reaction with gabapentin (hives and itchiness) pharm is asking if you are aware and want to still fill.  Please advise.    [facility]  Registration Specialist    [facility] [phone]

## 2014-04-05 NOTE — Telephone Encounter (Signed)
lyrica approved per [facility] case # [id number] valid 04/04/14-03/20/15.

## 2014-04-06 NOTE — Telephone Encounter (Signed)
Can still be filled as long as patient is aware. If she does not want to take the risk of an allergic reaction, which could be possible, then should return to discuss other options for symptom control, though there are limited options left.     Loyal Jacobson, MD  04/06/2014, 15:51
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
## 2014-04-10 NOTE — Telephone Encounter (Signed)
Pharm informed 04/10/14 saw

## 2014-04-17 ENCOUNTER — Other Ambulatory Visit: Payer: Self-pay

## 2015-05-26 ENCOUNTER — Other Ambulatory Visit: Payer: Self-pay

## 2016-03-10 ENCOUNTER — Ambulatory Visit (INDEPENDENT_AMBULATORY_CARE_PROVIDER_SITE_OTHER): Payer: PRIVATE HEALTH INSURANCE | Admitting: Family Medicine

## 2016-03-10 ENCOUNTER — Encounter (INDEPENDENT_AMBULATORY_CARE_PROVIDER_SITE_OTHER): Payer: Self-pay | Admitting: Family Medicine

## 2016-03-10 VITALS — BP 124/90 | HR 96 | Temp 99.2°F | Resp 16 | Ht 66.25 in | Wt 203.6 lb

## 2016-03-10 DIAGNOSIS — Z0000 Encounter for general adult medical examination without abnormal findings: Principal | ICD-10-CM

## 2016-03-10 DIAGNOSIS — Z720 Tobacco use: Secondary | ICD-10-CM
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
## 2016-03-10 NOTE — Progress Notes (Signed)
[facility]  [facility]    ID: [name] MoodSarah Linnemann   DOB: 06/03/1991  Date of Service: 03/10/2016     Chief Complaint(s):   Chief Complaint   Patient presents with    Physical       SUBJECTIVE:  [name] [name] is a 25 y.o. female who presents to clinic for a physical. 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 She was told her thyroid appeared on ultrasound to have "patchiness" and was recommended to get her thyroid checked for possible Hashimoto's.     Pt endorses weight gain. She said she used to weigh 183 lbs in July. Today, she weighs 203 lbs. She reports her weight has fluctuated up and down although she has tried to exercise and eat right.     She would like to quit smoking and has already tried the patch and the gum. She smokes less than a pack a day for the past ~ 6 years. She reports that she has mood swings and on further inquiry, denies feeling depressed but has PHQ 9 score of 21. She says she doesn't like the labels that people associate with depression when discussing depression with her. She denies any suicidal ideation with her depression.     PHQ 9 Follow Up Questionnaire  Little interest or pleasure in doing things.: Nearly every day  Feeling down, depressed, or hopeless: Nearly every day  Trouble falling or staying asleep, or sleeping too much.: Nearly every day (not getting good quality sleep)  Feeling tired or having little energy: Nearly every day  Poor appetite or overeating: Nearly every day (poor appetite)  Feeling bad about yourself/ that you are a failure in the past 2 weeks?: Not at all  Trouble concentrating on things in the past 2 weeks?: Nearly every day (feels OCD but easily distracted)  Moving/Speaking slowly or being fidgety or restless  in the past 2 weeks?: Nearly every day  Thoughts that you would be better off DEAD, or of hurting yourself in some way.: Not  at all  PHQ 9 Total: 21       Past Medical History:  Patient Active Problem List    Diagnosis Date Noted    Hx MRSA infection 02/19/2012     3 sets of 3 surv cultures all neg.      ASCUS on Pap smear for follow-up postpartum 06/05/2009    Chlamydia infection, current pregnancy, treated 06/05/2009     TOC @ NEXT VISIT   REPEAT WITH GBS CULTURE at 36 weeks           Medications:  Current Outpatient Prescriptions   Medication Sig    pregabalin (LYRICA) 50 mg Oral Capsule Take 1 Cap (50 mg total) by mouth Twice daily (Patient not taking: Reported on 04/02/2014)        Allergies:  Allergies   Allergen Reactions    Gabapentin Rash and Swelling    Imitrex [Sumatriptan Succinate] Rash    Other Rash     Bleach        Social History:  Social History   Substance Use Topics    Smoking status: Current Every Day Smoker     Packs/day: 0.25     Years: 3.00    Smokeless tobacco: Never Used      Comment: quit when she found out she was pregnant    Alcohol use No  ROS  Constitutional: positive for fatigue, malaise and flu like sx prior to menses, negative for fevers and chills  Eyes: positive for contacts/glasses  Ears, nose, mouth, throat, and face: positive for "burning sensation in eyes"  Respiratory: positive for cough, wheezing or SOB, all sx's associated with symptoms  Cardiovascular: positive for palpitations with exertion  Gastrointestinal: positive for constipation and stomach cramps, loose stool, alternate every couple of days  Hematologic/lymphatic: negative for easy bruising and bleeding  Musculoskeletal:back pain, lower leg pain  Neurological: positive for migraines   Behavioral/Psych: positive for mood swings  Endocrine: positive for temperature intolerance and always feeling hot, reports darker hair grown on body, hair thinning out and not as thick as it used to be      OBJECTIVE:  BP 124/90   Pulse 96   Temp 37.3 C (99.2 F) (Oral)    Resp 16   Ht 1.683 m (5' 6.25")   Wt 92.4 kg (203 lb 9.6 oz)   LMP  02/08/2016 (Exact Date)   BMI 32.61 kg/m2     Physical Exam:  General: Appears in no acute distress.  Eyes: Conjunctiva clear.    HEENT:. Mucous membranes are moist.   Neck: No JVD. No thyromegaly. No lymphadenopathy.  Lungs: Clear to auscultation bilaterally. No respiratory distress. No retractions or accessory muscle use.   Cardiovascular: Heart with a regular rate and regular rhythm without murmurs or gallop. Pulses normal.  Abdomen: Soft. Bowel sounds normal. Non-tender. No hepatosplenomegaly. No pulsatile masses.  Extremities: No cyanosis or edema.   Skin: Skin warm and dry. No rash or acute changes.  Lymphatics: No lymphadenopathy.  Psychiatric: Alert and oriented x3.    Labs:        ASSESSMENT and PLAN:  1. Annual physical exam       - Pt due for pap smear, declined at this visit and deferred to do it at another visit   - Ordered CBC, CMP, lipid panel, TSH with reflex T4  - Pt agreeable to review blood work prior to considering taking an antidepressant for her depression   Will consider starting pt on Bupropion to help with her mood as well as for smoking cessation   - RTC in 4 weeks to F/U lab work     Pt discussed with Dr. Valentino Saxon.   Jearld Lesch, MD 03/10/2016, 18:07        Visit Diagnoses and associated Orders -- Summary:        ICD-10-CM    1. Annual physical exam Z00.00 CBC/DIFF     COMPREHENSIVE METABOLIC PROFILE - [facility] ONLY     LIPID PANEL     THYROID STIMULATING HORMONE WITH FREE T4 REFLEX     I discussed the patient with the resident.  I reviewed the resident's note.  I agree with the findings and plan of care as documented in the resident's note.  Any exceptions/additions are edited/noted.    Alain Marion, MD  04/01/2016, 15:45

## 2016-03-11 ENCOUNTER — Encounter (INDEPENDENT_AMBULATORY_CARE_PROVIDER_SITE_OTHER): Payer: Self-pay | Admitting: Family Medicine

## 2016-03-11 ENCOUNTER — Ambulatory Visit: Payer: PRIVATE HEALTH INSURANCE | Attending: Family Medicine

## 2016-03-11 DIAGNOSIS — Z1329 Encounter for screening for other suspected endocrine disorder: Secondary | ICD-10-CM | POA: Insufficient documentation

## 2016-03-11 DIAGNOSIS — Z0000 Encounter for general adult medical examination without abnormal findings: Principal | ICD-10-CM | POA: Insufficient documentation

## 2016-03-11 LAB — CBC WITH DIFF
BASOPHIL #: 0.1 x10ˆ3/uL (ref 0.00–0.10)
BASOPHIL %: 1 % (ref 0–3)
EOSINOPHIL #: 0.3 x10ˆ3/uL (ref 0.00–0.50)
EOSINOPHIL %: 3 % (ref 0–5)
HCT: 43 % (ref 36.0–45.0)
HGB: 14.7 g/dL (ref 12.0–15.5)
LYMPHOCYTE #: 2.5 x10ˆ3/uL (ref 1.00–4.80)
LYMPHOCYTE %: 28 % (ref 15–43)
MCH: 28.9 pg (ref 27.5–33.2)
MCHC: 34.3 g/dL (ref 32.0–36.0)
MCV: 84.4 fL (ref 82.0–97.0)
MONOCYTE #: 0.6 x10ˆ3/uL (ref 0.20–0.90)
MONOCYTE %: 7 % (ref 5–12)
MONOCYTE %: 7 % (ref 5–12)
MPV: 8.2 fL (ref 7.4–10.5)
NEUTROPHIL #: 5.5 x10ˆ3/uL (ref 1.50–6.50)
NEUTROPHIL %: 61 % (ref 43–76)
PLATELETS: 298 x10ˆ3/uL (ref 150–450)
RBC: 5.1 x10ˆ6/uL (ref 4.00–5.10)
RDW: 13.4 % (ref 11.0–16.0)
WBC: 9 x10ˆ3/uL (ref 4.0–11.0)

## 2016-03-11 LAB — COMPREHENSIVE METABOLIC PROFILE - BMC/JMC ONLY
ALBUMIN/GLOBULIN RATIO: 1.4 (ref 0.8–2.0)
ALBUMIN: 4 g/dL (ref 3.5–5.0)
ALKALINE PHOSPHATASE: 45 U/L (ref 38–126)
ALT (SGPT): 13 U/L — ABNORMAL LOW (ref 14–54)
ANION GAP: 5 mmol/L (ref 3–11)
AST (SGOT): 14 U/L — ABNORMAL LOW (ref 15–41)
BILIRUBIN TOTAL: 1 mg/dL (ref 0.3–1.2)
BUN/CREA RATIO: 13 (ref 6–22)
BUN/CREA RATIO: 13 (ref 6–22)
BUN: 9 mg/dL (ref 6–20)
CALCIUM: 8.8 mg/dL (ref 8.6–10.3)
CHLORIDE: 106 mmol/L (ref 101–111)
CO2 TOTAL: 26 mmol/L (ref 22–32)
CREATININE: 0.69 mg/dL (ref 0.44–1.00)
ESTIMATED GFR: >60 mL/min/1.73mˆ2 (ref >60)
GLUCOSE: 84 mg/dL (ref 70–110)
GLUCOSE: 84 mg/dL (ref 70–110)
POTASSIUM: 4.4 mmol/L (ref 3.4–5.1)
PROTEIN TOTAL: 6.8 g/dL (ref 6.4–8.3)
SODIUM: 137 mmol/L (ref 136–145)

## 2016-03-11 LAB — LIPID PANEL
CHOL/HDL RATIO: 4.8
CHOL/HDL RATIO: 4.8
CHOLESTEROL: 173 mg/dL (ref 120–199)
HDL CHOL: 36 mg/dL — ABNORMAL LOW (ref 45–65)
LDL CALC: 115 mg/dL (ref <=130)
TRIGLYCERIDES: 111 mg/dL (ref 35–135)
VLDL CALC: 22 mg/dL (ref 5–35)

## 2016-03-11 LAB — THYROID STIMULATING HORMONE WITH FREE T4 REFLEX: TSH: 2.524 u[IU]/mL (ref 0.340–5.330)

## 2016-03-12 ENCOUNTER — Other Ambulatory Visit (INDEPENDENT_AMBULATORY_CARE_PROVIDER_SITE_OTHER): Payer: Self-pay | Admitting: Family Medicine

## 2016-03-12 DIAGNOSIS — Z1329 Encounter for screening for other suspected endocrine disorder: Secondary | ICD-10-CM

## 2016-03-12 LAB — THYROXINE, FREE (FREE T4): THYROXINE (T4), FREE: 0.95 ng/dL (ref 0.61–1.70)

## 2016-04-10 ENCOUNTER — Encounter (INDEPENDENT_AMBULATORY_CARE_PROVIDER_SITE_OTHER): Payer: PRIVATE HEALTH INSURANCE | Admitting: Family Medicine

## 2016-04-28 ENCOUNTER — Encounter (INDEPENDENT_AMBULATORY_CARE_PROVIDER_SITE_OTHER): Payer: PRIVATE HEALTH INSURANCE | Admitting: Family Medicine

## 2016-05-30 ENCOUNTER — Other Ambulatory Visit: Payer: Self-pay

## 2016-07-08 ENCOUNTER — Encounter (INDEPENDENT_AMBULATORY_CARE_PROVIDER_SITE_OTHER): Payer: Self-pay | Admitting: Family Medicine

## 2016-07-08 ENCOUNTER — Other Ambulatory Visit: Payer: PRIVATE HEALTH INSURANCE | Attending: Family Medicine

## 2016-07-08 ENCOUNTER — Ambulatory Visit (INDEPENDENT_AMBULATORY_CARE_PROVIDER_SITE_OTHER): Payer: PRIVATE HEALTH INSURANCE | Admitting: Family Medicine

## 2016-07-08 VITALS — BP 143/93 | HR 91 | Temp 99.0°F | Ht 66.0 in | Wt 198.0 lb

## 2016-07-08 DIAGNOSIS — N390 Urinary tract infection, site not specified: Secondary | ICD-10-CM

## 2016-07-08 DIAGNOSIS — M549 Dorsalgia, unspecified: Secondary | ICD-10-CM

## 2016-07-08 DIAGNOSIS — R319 Hematuria, unspecified: Secondary | ICD-10-CM | POA: Insufficient documentation

## 2016-07-08 MED ORDER — PHENAZOPYRIDINE 200 MG TABLET
200.00 mg | ORAL_TABLET | Freq: Three times a day (TID) | ORAL | 0 refills | Status: DC | PRN
Start: 2016-07-08 — End: 2016-11-12

## 2016-07-08 MED ORDER — SULFAMETHOXAZOLE 800 MG-TRIMETHOPRIM 160 MG TABLET
1.00 | ORAL_TABLET | Freq: Two times a day (BID) | ORAL | 0 refills | Status: AC
Start: 2016-07-08 — End: 2016-07-18

## 2016-07-08 NOTE — Nursing Note (Signed)
BP (!) 143/93   Pulse 91   Temp 37.2 C (99 F) (Oral)    Ht 1.676 m (5\' 6" )   Wt 89.8 kg (198 lb)   LMP 06/24/2016   SpO2 98%   BMI 31.96 kg/m2  [name] [name], RTR  07/08/2016, 17:33

## 2016-07-08 NOTE — Progress Notes (Signed)
[facility]  [address]  [place] [id number]  Dept: [phone]  Dept Fax: [phone]  Loc: [phone]  Loc Fax: [phone]    Patient name: .[name] [name]  Date of birth:04/11/1991    Date of service:07/08/16    CHIEF COMPLAINT: Urinary Retention; Blood in urine; and Back Pain      SUBJECTIVE: [name] [name] a 25 y.o. female who complains of urinary frequency, urgency and dysuria x 1 day.  She has a feeling that she is not able to pee although she was able to provide a sample for the urine.  She says that she still has the feeling that she has a full bladder  after urinating.  She has noticed some blood in her urine.  Denies any history of renal stones., without flank pain, fever, chills,denies history of kidney stones.    Medical History:  Past Medical History:   Diagnosis Date    ASCUS on Pap smear for follow-up postpartum 06/05/2009         Past Surgical History:   Procedure Laterality Date    HX FRACTURE TX  2009     FIXATION OF JAW FRACTURE    HX FRACTURE TX      Fracture TX         Family Medical History     None            Social History     Social History    Marital status: Married     Spouse name: N/A    Number of children: 0    Years of education: 13     Occupational History     No Employer     Social History Main Topics    Smoking status: Current Every Day Smoker     Packs/day: 0.25     Years: 3.00    Smokeless tobacco: Never Used      Comment: quit when she found out she was pregnant    Alcohol use No    Drug use: No    Sexual activity: Yes     Partners: Male     Other Topics Concern    Not on file     Social History Narrative     Expanded Substance History     Additional history       Review of Systems   Pertinent items are noted in HPI. All pertinent positives and negatives noted in the HPI  Constitutional: No recent illness, no fever, no chills  Neuro: No dizziness, No lightheadedness, No syncope, no hx of  seizures  Muskuloskeletal: No myalgias, No arthralgias.  GI.no abdominal pain,nausea,vomiting,diarrhea.  GU. + dysuria,+ frequency of urination, + feeling of retention of urine.  Skin: no rashes.    OBJECTIVE:   EXAM:  General Vitals: BP (!) 143/93   Pulse 91   Temp 37.2 C (99 F) (Oral)    Ht 1.676 m (5\' 6" )   Wt 89.8 kg (198 lb)   LMP 06/24/2016   SpO2 98%   BMI 31.96 kg/m2  General: acutely ill and mild distress  Lungs: clear to auscultation bilaterally.   Cardiovascular:    Heart regular rate and rhythm, S1, S2 normal, no murmur, click, rub or gallop  Abdomen: soft, + suprapubic and bilateral flank tenderness, non-distended, no hepatosplenomegaly, no masses and no hernias  Skin: Skin color, texture, turgor normal.   Neurologic:  alert and oriented x3    LABS:  Urine Dip Results:  QC Urine: y  Time collected: 1740  Glucose: Negative  Bilirubin: Negative  Ketones: Negative  Urine Specific Gravity: 1.020  Blood (urine): Moderate (2+)  pH: 7.0  Protein: Negative  Urobilinogen: Normal   Nitrite: Negative  Leukocytes: Trace  Initials: alp    Assessment and plan:     ICD-10-CM    1. Back pain, unspecified back location, unspecified back pain laterality, unspecified chronicity M54.9 POCT URINE DIPSTICK     URINE CULTURE   2. Hematuria R31.9 POCT URINE DIPSTICK     URINE CULTURE   3. UTI (urinary tract infection) N39.0      Medication Orders   Medications    trimethoprim-sulfamethoxazole (BACTRIM DS) 160-800mg  per tablet     Sig: Take 1 Tab (160 mg total) by mouth Every 12 hours for 10 days     Dispense:  20 Tab     Refill:  0    phenazopyridine (PYRIDIUM) 200 mg Oral Tablet     Sig: Take 1 Tab (200 mg total) by mouth Three times a day as needed for Pain     Dispense:  15 Tab     Refill:  0    UTI-will prescribe antibiotic as above.  Advised the patient that if she is not able to pee in the next 4 hours as compared to her fluid intake or if he still has a feeling of full bladder with lower abdominal pain within the  next 2-3 hours then she should go to the emergency room for possible catheterization, evaluation for retention.  She agrees and understands the plan.    Antibiotics as above, with warnings including allergic reaction, and C. Difficile, both of which can be severe. Patient advised these type of reactions are  unpredictable. Yogurt/probiotics prophylaxis discussed. Pt verbalised understanding to th above plan & verbalised understanding.All questions answered completely.    Pyridium with precautions that it may get discolor urine, plenty of p.o. hydration, will obtain urine culture sensitivity and if resistant to the current antibiotic will inform patient and switch antibiotics.patient agree with the above plan and verbalized understanding.    Plan was discussed and patient/parent verbalized understanding.  If symptoms are not improving within the next couple of days,  advised patient/parent  to followup with primary care or return to the Urgent Care for further evaluation.  Go to Emergency Department immediately for further work up if worsening symptoms or other medical concerns.      Cristie HemPriyankar Jette Lewan, MD  07/08/2016, 17:41

## 2016-07-08 NOTE — Nursing Note (Signed)
07/08/16 1700   Urine   QC Urine y   Time collected 1740   Glucose Negative   Bilirubin Negative   Ketones Negative   Urine Specific Gravity 1.020   Blood (urine) Moderate (2+)   pH 7.0   Protein Negative   Urobilinogen Normal    Nitrite Negative   Leukocytes Trace   Initials alp

## 2016-07-08 NOTE — Patient Instructions (Signed)
[facility]  [address]  [place], [place] [id number]  Phone: [phone]  Fax: [phone]             Open Daily 8:00am - 8:00pm, except Sundays 12pm-8pm         ~ Closed Thanksgiving and Christmas Day     Attending Caregiver: Cristie HemPriyankar Mikaylee Arseneau, MD      Today's orders:   Orders Placed This Encounter    POCT URINE DIPSTICK        Prescription(s) E-Rx to:  [facility] [place], [place] - [address]    ________________________________________________________________________  Short Term Disability and Family Medical Leave Act  [facility] does NOT provide assistance with any disability applications.  If you feel your medical condition requires you to be on disability, you will need to follow up with  Your primary care physician or a specialist.  We apologize for any inconvenience.    For Medication Prescribed by [facility]:  As an Urgent Care facility, our clinic does NOT offer prescription refills over the telephone.    If you need more of the medication one of our medical providers prescribed, you will  Either need to be re-evaluated by us or see your primary care physician.    ________________________________________________________________________      It is very important that we have a phone number that is the single best way to contact you in the event that we become aware of important clinical information or concerns after your discharge.  If the phone number you provided at registration is NOT this number you should inform staff and registration prior to leaving.      Your treatment and evaluation today was focused on identifying and treating potentially emergent conditions based on your presenting signs, symptoms, and history.  The resulting initial clinical impression and treatment plan is not intended to be definitive or a substitute for a full physical examination and evaluation by your primary care provider.  If your symptoms persist, worsen, or  you develop any new or concerning symptoms, you need to be evaluated.      If you received x-rays during your visit, be aware that the final and formal interpretation of those films by a radiologist may occur after your discharge.  If there is a significant discrepancy identified after your discharge, we will contact you at the telephone number provided at registration.      If you received a pelvic exam, you may have cultures pending for sexually transmitted diseases.  Positive cultures are reported to the [facility] Department of Health as required by state law.  You should be contacted if you cultures are positive.  We will not contact you if they are negative.  You did NOT receive a PAP smear (the screening test for cervical).  This specific test for women is best performed by your gynecologist or primary care provider when indicated.      If you are over 26 year old, we cannot discuss your personal health information with a parent, spouse, family member, or anyone else without your express consent.  This does not include those who have legitimate access to your records and information to assist in your care under the provisions of HIPAA [facility](Health Insurance Portability and Accountability Act) law, or those to whom you have previously given express written consent to do so, such a legal guardian or Power of [place].      You may have  received medication that may cause you to feel drowsy and/or light headed for several hours.  You may even experience some amnesia of your stay.  You should avoid operating a motor vehicle or performing any activity requiring complete alertness or coordination until you feel fully awake (approximately 24-48 hours).  Avoid alcoholic beverages.  You may also have a dry mouth for several hours.  This is a normal side effect and will disappear as the effects of the medication wear off.      Instructions discussed with patient upon discharge by clinical staff with all questions answered.  Please  call [facility] [phone]) if any further questions.  Go immediately to the emergency department if any concern or worsening symptoms.      Cristie HemPriyankar Krrish Freund, MD 07/08/2016, 17:41

## 2016-07-09 ENCOUNTER — Encounter (INDEPENDENT_AMBULATORY_CARE_PROVIDER_SITE_OTHER): Payer: Self-pay | Admitting: Family Medicine

## 2016-07-09 NOTE — Nursing Note (Signed)
Pt seen in urgent care yesterday. Attempted to call for follow up, no answer at this time. Left VM to call back with any questions or concerns.

## 2016-07-10 ENCOUNTER — Encounter (INDEPENDENT_AMBULATORY_CARE_PROVIDER_SITE_OTHER): Payer: Self-pay | Admitting: Family Medicine

## 2016-07-10 LAB — URINE CULTURE: URINE CULTURE: 30000 — AB

## 2016-07-10 NOTE — Progress Notes (Signed)
Urine culture did not grow any bacteria that would cause urine tract infection.  Continue the current treatment.  Follow up with ER/urgent care/PCP if still symptomatic or worse.    Cristie HemPriyankar Vallarie Fei, MD  07/10/2016, 14:24

## 2016-11-05 ENCOUNTER — Encounter (INDEPENDENT_AMBULATORY_CARE_PROVIDER_SITE_OTHER): Payer: PRIVATE HEALTH INSURANCE | Admitting: Physician Assistant

## 2016-11-12 ENCOUNTER — Encounter (INDEPENDENT_AMBULATORY_CARE_PROVIDER_SITE_OTHER): Payer: Self-pay | Admitting: Physician Assistant

## 2016-11-12 ENCOUNTER — Ambulatory Visit (INDEPENDENT_AMBULATORY_CARE_PROVIDER_SITE_OTHER): Payer: PRIVATE HEALTH INSURANCE | Admitting: Physician Assistant

## 2016-11-12 ENCOUNTER — Encounter (INDEPENDENT_AMBULATORY_CARE_PROVIDER_SITE_OTHER): Payer: PRIVATE HEALTH INSURANCE | Admitting: Physician Assistant

## 2016-11-12 VITALS — BP 116/70 | HR 60 | Temp 99.3°F | Resp 16 | Ht 66.25 in | Wt 204.0 lb

## 2016-11-12 DIAGNOSIS — M2550 Pain in unspecified joint: Secondary | ICD-10-CM

## 2016-11-12 DIAGNOSIS — E669 Obesity, unspecified: Secondary | ICD-10-CM

## 2016-11-12 DIAGNOSIS — R202 Paresthesia of skin: Secondary | ICD-10-CM

## 2016-11-12 DIAGNOSIS — R5383 Other fatigue: Secondary | ICD-10-CM

## 2016-11-12 DIAGNOSIS — Z720 Tobacco use: Secondary | ICD-10-CM

## 2016-11-12 MED ORDER — BUPROPION HCL SR 150 MG TABLET,12 HR SUSTAINED-RELEASE
150.00 mg | ORAL_TABLET | Freq: Two times a day (BID) | ORAL | 2 refills | Status: DC
Start: 2016-11-12 — End: 2022-10-12

## 2016-11-12 NOTE — Progress Notes (Signed)
[name] [name]  Date of Service: 11/12/2016    Chief complaint:   Chief Complaint   Patient presents with   . Lab Results     Subjective  26 year old female presents today for lab follow-up.   However patient actually had these labs performed about 9 months ago.  She just never came in follow-up for them.  She was told that they were normal.  She initially had them performed because she was having hair thinning weight gain joint pain. 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  However reviewed all of her labs with her today including CBC CMP TSH and lipid panel.  These were all within normal limits.  Patient remains with symptoms.  She is extremely fatigued.  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  She also has her brother-in-law and their 2 children living in her home.  Even with this at home she does not know why she feels so tired constantly.  She does not take a multivitamin.  She also is worried about her weight, she feels like no matter how much exercise she gets she can't lose weight.  She least walks once to twice daily.  She states she does not eat breakfast but does eat lunch and dinner.  Tries to eat a well-balanced diet.  She also would like to quit smoking.  She smokes about 4 cigarettes per day.  She has tried the patch but developed a rash.  She cannot tolerate the gum.  Her brother had suicidal ideations with Chantix so she is afraid to try this medication.  She is open to starting Wellbutrin.    Current Outpatient Prescriptions   Medication Sig   . BIOTIN ORAL Take by mouth   . buPROPion (WELLBUTRIN SR) 150 mg Oral Tablet Sustained Release 12 hr Take 1 Tab (150 mg total) by mouth Twice daily   . multivit with calcium,iron,min [facility](WOMEN'S MULTIPLE VITAMINS ORAL) Take by mouth     Objective  Vitals: BP 116/70  Pulse 60  Temp 37.4 C (99.3 F)  (Oral)   Resp 16  Ht 1.683 m (5' 6.25")  Wt 92.5 kg (204 lb)  LMP 10/29/2016  BMI 32.68 kg/m2  General: no distress  Psychiatric: AOx3, normal affect, behavior, memory, thought content, judgement, and speech. and no suicidal or homicidal ideations    Assessment/Plan    ICD-10-CM    1. Arthralgia, unspecified joint M25.50 THYROID STIMULATING HORMONE (SENSITIVE TSH)     THYROXINE, FREE (FREE T4)     THIIODOTHYRONINE, TOTAL (TOTAL T3)     IRON TRANSFERRIN AND TIBC     FERRITIN     VITAMIN B12     VITAMIN B1 (THIAMIN), WHOLE BLOOD     VITAMIN B6 (PYRIDOXAL 5-PHOSPHATE), PLASMA     FOLATE     OUTSIDE CONSULT/REFERRAL PROVIDER(AMB)   2. Paresthesia R20.2 THYROID STIMULATING HORMONE (SENSITIVE TSH)     THYROXINE, FREE (FREE T4)     THIIODOTHYRONINE, TOTAL (TOTAL T3)     IRON TRANSFERRIN AND TIBC     FERRITIN     VITAMIN B12     VITAMIN B1 (THIAMIN), WHOLE BLOOD     VITAMIN B6 (PYRIDOXAL 5-PHOSPHATE), PLASMA     FOLATE     OUTSIDE CONSULT/REFERRAL PROVIDER(AMB)   3. Fatigue, unspecified type R53.83 THYROID STIMULATING HORMONE (SENSITIVE TSH)  THYROXINE, FREE (FREE T4)     THIIODOTHYRONINE, TOTAL (TOTAL T3)     IRON TRANSFERRIN AND TIBC     FERRITIN     VITAMIN B12     VITAMIN B1 (THIAMIN), WHOLE BLOOD     VITAMIN B6 (PYRIDOXAL 5-PHOSPHATE), PLASMA     FOLATE     OUTSIDE CONSULT/REFERRAL PROVIDER(AMB)   4. Obesity E66.9 THYROID STIMULATING HORMONE (SENSITIVE TSH)     THYROXINE, FREE (FREE T4)     THIIODOTHYRONINE, TOTAL (TOTAL T3)     IRON TRANSFERRIN AND TIBC     FERRITIN     VITAMIN B12     VITAMIN B1 (THIAMIN), WHOLE BLOOD     VITAMIN B6 (PYRIDOXAL 5-PHOSPHATE), PLASMA     FOLATE     OUTSIDE CONSULT/REFERRAL PROVIDER(AMB)   5. Tobacco use Z72.0 buPROPion (WELLBUTRIN SR) 150 mg Oral Tablet Sustained Release 12 hr       Orders Placed This Encounter   . THYROID STIMULATING HORMONE (SENSITIVE TSH)   . THYROXINE, FREE (FREE T4)   . THIIODOTHYRONINE, TOTAL (TOTAL T3)   . IRON TRANSFERRIN AND TIBC   . FERRITIN   .  VITAMIN B12   . VITAMIN B1 (THIAMIN), WHOLE BLOOD   . VITAMIN B6 (PYRIDOXAL 5-PHOSPHATE), PLASMA   . FOLATE   . OUTSIDE CONSULT/REFERRAL PROVIDER(AMB)   . buPROPion (WELLBUTRIN SR) 150 mg Oral Tablet Sustained Release 12 hr       BMI addressed: Advised on diet, weight loss, and exercise to reduce above normal BMI.    Labs ordered as above to evaluate.  Referred to metabolic center for diet and exercise program.  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    Wellbutrin for tobacco cessation.  Given with warnings and side effects.    Follow-up in 2 weeks    Alfredo Martinez, PA-C  Preceptor Dr. Melvyn Neth

## 2016-11-12 NOTE — Nursing Note (Signed)
Chief Complaint:   Chief Complaint     Lab Results             Functional Health Screen  Functional Health Screening:     Patient is under 18:  No   Have you had a recent unexplained weight loss or gain?:  No   Because we are aware of abuse and domestic violence today, we ask all patients: Are you being hurt, hit, or frightened by anyone at your home or in your life?:  No   Do you have any basic needs within your home that are not being met? (such as Food, Shelter, Games developer, Transportation):  No   Patient is under 18 and therefore has no Advance Directives:  No   Patient has:  No Advance   Patient has Advance Directive:  No   Patient offered:  Refused Packet        BP 116/70  Pulse 60  Temp 37.4 C (99.3 F) (Oral)   Resp 16  Ht 1.683 m (5' 6.25")  Wt 92.5 kg (204 lb)  LMP 10/29/2016  BMI 32.68 kg/m2  History   Smoking Status   . Current Every Day Smoker   . Packs/day: 0.25   . Years: 3.00   Smokeless Tobacco   . Never Used     Comment: quit when she found out she was pregnant     Patient Health Rating     In general, would you say your health is:: Very Good (7-8)  How confident are you that you can control and manage most of your health problems ?: Very Confident  Depression Screening  PHQ Questionnaire     Allergies:  Allergies   Allergen Reactions   . Gabapentin Rash and Swelling   . Imitrex [Sumatriptan Succinate] Rash   . Other Rash     Bleach     Medication History  Reviewed for OTC medication and any new medications, provider will review medication history  Results through Enter/Edit  No results found for this or any previous visit (from the past 24 hour(s)).  POCT Results  Care Team  Patient Care Team:  Sandi Mariscal, MD as PCP - General (GENERAL)  Debbrah Alar, MD as PCP - Managed Care/Insurance ([place])    Jilda Roche, [place]  11/12/2016, 17:51

## 2016-11-26 ENCOUNTER — Encounter (INDEPENDENT_AMBULATORY_CARE_PROVIDER_SITE_OTHER): Payer: Self-pay | Admitting: Physician Assistant

## 2016-12-24 ENCOUNTER — Encounter (INDEPENDENT_AMBULATORY_CARE_PROVIDER_SITE_OTHER): Payer: Self-pay | Admitting: Physician Assistant

## 2017-06-05 ENCOUNTER — Other Ambulatory Visit: Payer: Self-pay

## 2018-08-02 ENCOUNTER — Ambulatory Visit (INDEPENDENT_AMBULATORY_CARE_PROVIDER_SITE_OTHER): Payer: 59 | Admitting: Physician Assistant

## 2018-08-02 ENCOUNTER — Encounter (INDEPENDENT_AMBULATORY_CARE_PROVIDER_SITE_OTHER): Payer: Self-pay

## 2018-08-02 VITALS — BP 132/99 | HR 99 | Temp 99.2°F | Resp 16 | Ht 66.0 in | Wt 190.0 lb

## 2018-08-02 DIAGNOSIS — J029 Acute pharyngitis, unspecified: Secondary | ICD-10-CM

## 2018-08-02 DIAGNOSIS — B353 Tinea pedis: Secondary | ICD-10-CM

## 2018-08-02 LAB — POCT RAPID STREP A: Rapid Strep A Screen POCT: NEGATIVE

## 2018-08-02 MED ORDER — AMOXICILLIN 500 MG PO CAPS
500.00 mg | ORAL_CAPSULE | Freq: Two times a day (BID) | ORAL | 0 refills | Status: AC
Start: 2018-08-02 — End: 2018-08-12

## 2018-08-02 NOTE — Progress Notes (Signed)
Subjective:    Patient ID: Alyssa Torres is a 28 y.o. female.    Chief Complaint   Patient presents with    Sore Throat     Pt. has had peeling, burning, stinging of hands and feet.  She has noticed a lump feeling in the right side of her throat.  No fever noticed.         Sore Throat    This is a new problem. The current episode started in the past 7 days. The problem has been gradually worsening. The pain is moderate. Associated symptoms include swollen glands. Pertinent negatives include no abdominal pain, coughing, diarrhea, drooling, ear discharge, ear pain, headaches, hoarse voice, neck pain, shortness of breath, stridor or vomiting. Treatments tried: home regimen. The treatment provided mild relief.      Also complains of dry, scaly, peeling, burning palms and soles. Great toenails are broken. Has a 5 mo baby. Working at a medical facility and uses sanitizer frequently.     No Known Allergies    History reviewed. No pertinent past medical history.    Current Outpatient Medications   Medication Sig Dispense Refill    Norgestim-Eth Estrad Triphasic 0.18/0.215/0.25 MG-25 MCG Tab Take 1 tablet by mouth daily  0     No current facility-administered medications for this visit.          The following portions of the patient's history were reviewed and updated as appropriate: allergies, current medications, past family history, past medical history, past social history, past surgical history and problem list.    Review of Systems   Constitutional: Negative for fever.   HENT: Positive for sore throat. Negative for drooling, ear discharge, ear pain and hoarse voice.    Eyes: Negative for pain.   Respiratory: Negative for cough, shortness of breath and stridor.    Cardiovascular: Negative for chest pain.   Gastrointestinal: Negative for abdominal pain, diarrhea and vomiting.   Endocrine: Negative for polyuria.   Genitourinary: Negative for dysuria.   Musculoskeletal: Negative for back pain and neck pain.   Skin:  Negative for rash.        Lesions on hands, feet and toenails.    Allergic/Immunologic: Negative for immunocompromised state.   Neurological: Negative for headaches.   Hematological: Does not bruise/bleed easily.   Psychiatric/Behavioral: Negative for behavioral problems.         Objective:    BP (!) 132/99    Pulse 99    Temp 99.2 F (37.3 C) (Oral)    Resp 16    Ht 1.676 m (5\' 6" )    Wt 86.2 kg (190 lb)    LMP 07/19/2018    BMI 30.67 kg/m       Vital signs were checked and discussed with the patient. Appropriate measures have been taken. The patient will follow up with primary care physician for further diagnostic evaluation and treatment if there are any concerns.   BP elevated today. The patient will f/up with PCP.      Physical Exam  Vitals signs and nursing note reviewed.   Constitutional:       General: She is not in acute distress.     Appearance: She is well-developed.   HENT:      Head: Normocephalic.      Right Ear: Tympanic membrane and ear canal normal.      Left Ear: Tympanic membrane and ear canal normal.      Nose: Nose normal.  Mouth/Throat:      Pharynx: Oropharyngeal exudate and posterior oropharyngeal erythema present.     Eyes:      General:         Right eye: No discharge.         Left eye: No discharge.      Conjunctiva/sclera: Conjunctivae normal.      Pupils: Pupils are equal, round, and reactive to light.   Neck:      Musculoskeletal: Normal range of motion.   Cardiovascular:      Rate and Rhythm: Normal rate and regular rhythm.      Heart sounds: Normal heart sounds.   Pulmonary:      Effort: Pulmonary effort is normal. No respiratory distress.      Breath sounds: Normal breath sounds. No wheezing or rales.   Musculoskeletal: Normal range of motion.        Hands:         Feet:    Skin:     General: Skin is warm.   Neurological:      Mental Status: She is alert.          Lab Results from today's visit:  Recent Results (from the past 12 hour(s))   POCT RAPID STREP A    Collection Time:  08/02/18 11:35 AM   Result Value Ref Range    POCT QC Pass     Rapid Strep A Screen POCT Negative  Negative    Comment       Negative Results should be confirmed by throat Cx to confirm absence of Strep A inf.       Radiology Results from today's visit:  No results found.        Assessment and Plan:       Noha was seen today for sore throat.    Diagnoses and all orders for this visit:    Pharyngitis, unspecified etiology with lymphadenopathy.  -     amoxicillin (AMOXIL) 500 MG capsule; Take 1 capsule (500 mg total) by mouth 2 (two) times daily for 10 days;  requested due to S&S consistent with strep throat.        Sore throat  -     POCT RAPID STREP A    Tinea pedis of both feet: pt prefers OTC meds first. The dry, scaly, lesions on her hands and feet likely the combination of fungal, bacterial and viral infection, plus stress and hygiene. The treatment and home care were discussed with the patient. The patient states that she understands and agrees with the above plans. FU with podiatrist PRN. Use creams, etc.           Please follow up with your primary care physician if symptoms persist.            Danella Deis, PA  [facility]  08/02/2018  11:59 AM

## 2018-08-02 NOTE — Patient Instructions (Signed)
Please follow up with your primary care physician if symptoms persist.  Follow up with foot doctor as needed.       When You Have a Sore Throat    A sore throat can be painful. There are many reasons why you may have a sore throat. Your healthcare provider will work with you to find the cause of your sore throat. He or she will also find the best treatment for you.   What causes a sore throat?  Sore throats can be caused or worsened by:    Cold or flu viruses   Bacteria   Irritants such as tobacco smoke or air pollution   Acid reflux  A healthy throat  The tonsils are on the sides of the throat near the base of the tongue. They collect viruses and bacteria and help fight infection. The throat (pharynx) is the passage for air. Mucus from the nasal cavity also moves down the passage.   An inflamed throat  The tonsils and pharynx can become inflamed due to a cold or flu virus. Postnasal drip (excess mucus draining from the nasal cavity) can irritate the throat. It can also make the throat or tonsils more likely to be infected by bacteria. Severe, untreated tonsillitis in children or adults can cause a pocket of pus (abscess) to form near the tonsil.   Your evaluation  A medical evaluation can help find the cause of your sore throat. It can also help your healthcare providerchoose the best treatment for you. The evaluation may include a health history, physical exam, and diagnostic tests.   Health history  Your healthcare provider may ask you the following:    How long has the sore throat lasted and how have you been treating it?   Do you have any other symptoms, such as body aches, fever, or cough?   Does your sore throat recur? If so, how often? How many days of school or work have you missed because of a sore throat?   Do you have trouble eating or swallowing?   Have you been told that you snore or have other sleep problems?   Do you have bad breath?   Do you cough up bad-tasting mucus?  Physical exam   During the exam, your healthcare provider checks your ears, nose, and throat for problems. He or she also checks for swelling in the neck, and may listen to your chest.   Possible tests  Other tests your healthcare provider may perform include:    A throat swab to check for bacteria such asstreptococcus (the bacteria that causes strep throat)   A blood test to check for mononucleosis (a viral infection)   A chest X-ray to rule out pneumonia, especially if you have a cough  Treating a sore throat  Treatment depends on many factors. What is the likely cause? Is the problem recent? Does it keep coming back? In many cases, the best thing to do is to treat the symptoms, rest, and let the problem heal itself. Antibiotics may help clear up some bacterial infections. For cases of severe or recurring tonsillitis, the tonsils may need to be removed.   Relieving your symptoms   Don't smoke, and stay away from secondhand smoke.   For children, try throat sprays or frozen ice pops. Adults and older children may try lozenges.   Drink warm liquids to soothe the throat and help thin mucus. Stay away from alcohol, spicy foods, and acidic drinks such as orange juice.  These can irritate the throat.   Gargle with warm saltwater ( 1teaspoon of salt to 8ounces of warm water).   Use a humidifier to keep air moist and relieve throat dryness.   Try over-the-counter pain relievers such as acetaminophen or ibuprofen. Use as directed, and don't exceed the recommended dose. Don't give aspirin to children under age18.    Are antibiotics needed?  If your sore throat is due to a bacterial infection, antibiotics may speed healing and prevent complications. Although group A streptococcus (strep throat) is the major treatable infection for a sore throat, strep throat causes only 5% to 15% of sore throats in adults who seek medical care. Most sore throats are caused by cold or flu viruses. And antibiotics don't treat viral illness. In  fact, using antibiotics when they're not needed may lead to bacteria that are harder to kill. Your healthcare provider will prescribe antibiotics only if he or she thinks they are likely to help.   If antibiotics are prescribed  Take the medicine exactly as directed. Be sure to finish your prescription even if you're feeling better. Ask your healthcare provider or pharmacist what side effects are common and what to do about them.   Is surgery needed?  In some cases, tonsils need to be removed. This is often done as outpatient (same-day) surgery. Your healthcare provider may advise removing the tonsils in cases of:    Several severe bouts of tonsillitis in a year. "Severe" episodes include those that lead to missed days of school or work, or that need to be treated with antibiotics.   Tonsillitis that causes breathing problems during sleep   Tonsillitis caused by food particles collecting in pouches in the tonsils (cryptic tonsillitis)  When to call your healthcare provider   Call your healthcare provider immediately if any of the following occur:    Problems swallowing   Symptoms worsen, or new symptoms develop.   Swollen tonsils make breathing difficult.   The pain is severe enough to keep you from drinking liquids.   If a skin rash or hives, develops, call your healthcare provider immediately. Any of these could signal an allergic reaction to antibiotics.   Symptoms don't improve within a week.   Symptoms don't improve within 2 to 3 days of starting antibiotics.  Call 911  Call 911 if any of the following occur:    Trouble breathing or problems catching your breath may be a medical emergency.   Skin is blue, purple or gray in color   Trouble talking   Feeling dizzy or faint   Feeling of doom  StayWell last reviewed this educational content on 01/10/2018   2000-2019 [employer], [place]. [address] [place], [place] [address]. All rights reserved. This information is not intended as a  substitute for professional medical care. Always follow your healthcare professional's instructions.            Athlete's Foot    Athlete's foot (tinea pedis) is caused by a fungal infection in the skin. It affects the skin between the toes, causing cracks in the skin called fissures. It can also affect the bottom of the foot where it causes dry white scales and peeling of the skin. This infection is more likely to occur when the foot is in hot, sweaty socks and shoes for long periods of time. You may feel itching and burning between your toes. This infection is treated with skin creams or medicine taken by mouth.   Home  care  The following are general care guidelines:    It's important to keep the feet dry. Use absorbent cotton socks and change them if they become sweaty. Or wear an open-toe shoe or sandal. Wash the feet at least once a day with soap and water.   Apply the antifungal cream as prescribed. Some antifungal creams are available without a prescription.   It may take a week before the rash starts to improve. It can take about 3 to 4 weeks to completely clear. Continue the medicine until the rash is all gone.   Use over-the-counter antifungal powders or sprays on your feet after exposure to high-risk environments, such as public showers, gyms, and locker rooms. This can help prevent future infections. Wearing appropriate shoes in these situations can help.  Prevention  These tips may help prevent athlete's foot:    Don't share shoes or socks with someone who has athlete's foot.   Don't walk barefoot in places where a fungal infection can spread quickly such as locker rooms, showers, and swimming pools.   Change your socks regularly.   Alternate shoes to help with drying.  Follow-up care  Follow up with your healthcare provider as recommended if the rash doesn't improve after 10 days of treatment, or if the rash continues to spread.   When to seek medical care  Get medical attention right away if  any of the following occur:    Fever of 100.547F (38C) or higher, or as directed   Increasing redness or swelling of the foot   Infection comes back soon after treatment   Pus draining from cracks in the skin  StayWell last reviewed this educational content on 02/11/2015   2000-2019 [employer], [place]. [address] [place], [place] [address]. All rights reserved. This information is not intended as a substitute for professional medical care. Always follow your healthcare professional's instructions.    Hand, Foot, and Mouth Disease    You have been diagnosed with "Hand, Foot and Mouth disease."    Hand, Foot, and Mouth disease is a viral infection usually caused by a coxsackie virus. It usually happens in children but is also seen in adults.    Symptoms of Hand, Foot, and Mouth disease are fever (temperature higher than 100.547F / 38C) and small fluid-filled blisters (vesicles). The blisters cause a rash in the mouth, hands and feet and diaper area. Not all patients get the rash all over. Some other symptoms are fever, vomiting (throwing up), and diarrhea (loose bowel movements). Symptoms usually last 5-8 days.    Treatment is with pain and fever medicine and fluids like Pedialyte. Since it is a viral infection, antibiotics do not work.    Sometimes a numbing medicine may be prescribed. However, some medicines may not be safe for younger patients if they swallow large amounts. Be sure to follow the instructions for the medicine carefully.    YOU SHOULD SEEK MEDICAL ATTENTION IMMEDIATELY, EITHER HERE OR AT THE NEAREST EMERGENCY DEPARTMENT, IF ANY OF THE FOLLOWING OCCURS:   Signs of dehydration (dry mouth, not urinating, sunken eyes).   Worsening symptoms.   Not getting better or feeling sicker at any time.    If you can't follow up with your doctor, or if at any time you feel you need to be rechecked or seen again, come back here or go to the nearest emergency department.

## 2018-08-05 ENCOUNTER — Telehealth (INDEPENDENT_AMBULATORY_CARE_PROVIDER_SITE_OTHER): Payer: Self-pay

## 2018-08-05 NOTE — Telephone Encounter (Signed)
Called to check on patient after recent visit. Left a message to call back if any questions or concerns.

## 2018-11-04 ENCOUNTER — Encounter (INDEPENDENT_AMBULATORY_CARE_PROVIDER_SITE_OTHER): Payer: Self-pay | Admitting: Family Medicine

## 2019-06-02 ENCOUNTER — Telehealth (INDEPENDENT_AMBULATORY_CARE_PROVIDER_SITE_OTHER): Payer: Self-pay | Admitting: Pediatrics

## 2019-06-02 NOTE — Telephone Encounter (Signed)
Patient called from [phone] with request for something to help her stop smoking, states Dr. Tamala Julian had offered this before to help her quit because this would benefit her children. Please advise. Warren Lacy, LPN  [id number]/67/2094, 70:96

## 2019-06-22 ENCOUNTER — Encounter (INDEPENDENT_AMBULATORY_CARE_PROVIDER_SITE_OTHER): Payer: Self-pay

## 2019-06-22 ENCOUNTER — Ambulatory Visit (INDEPENDENT_AMBULATORY_CARE_PROVIDER_SITE_OTHER): Payer: No Typology Code available for payment source | Admitting: Physician Assistant

## 2019-06-22 VITALS — BP 133/98 | HR 97 | Temp 98.4°F | Resp 18 | Ht 68.0 in | Wt 187.0 lb

## 2019-06-22 DIAGNOSIS — J029 Acute pharyngitis, unspecified: Secondary | ICD-10-CM

## 2019-06-22 DIAGNOSIS — Z20828 Contact with and (suspected) exposure to other viral communicable diseases: Secondary | ICD-10-CM

## 2019-06-22 DIAGNOSIS — M7918 Myalgia, other site: Secondary | ICD-10-CM

## 2019-06-22 DIAGNOSIS — R52 Pain, unspecified: Secondary | ICD-10-CM

## 2019-06-22 LAB — POCT RAPID STREP A: Rapid Strep A Screen POCT: NEGATIVE

## 2019-06-22 LAB — VH AMB POCT SOFIA (TM)SARS CORONAVIRUS ANTIGEN FIA: Sofia SARS-CoV-2 Ag POCT: NEGATIVE

## 2019-06-22 LAB — POCT INFLUENZA A/B
POCT Rapid Influenza A AG: NEGATIVE
POCT Rapid Influenza B AG: NEGATIVE

## 2019-06-22 NOTE — Patient Instructions (Signed)
When You Have a Sore Throat  A sore throat can be painful. There are many reasons why you may have a sore throat. Your healthcare provider will work with you to find the cause of your sore throat. He or she will also find the best treatment for you.     What causes a sore throat?  Sore throats can be caused or worsened by:   · Cold or flu viruses  · Bacteria  · Irritants such as tobacco smoke or air pollution  · Acid reflux  A healthy throat  The tonsils are on the sides of the throat near the base of the tongue. They collect viruses and bacteria and help fight infection. The throat (pharynx) is the passage for air. Mucus from the nasal cavity also moves down the passage.   An inflamed throat  The tonsils and pharynx can become inflamed due to a cold or flu virus. Postnasal drip (excess mucus draining from the nasal cavity) can irritate the throat. It can also make the throat or tonsils more likely to be infected by bacteria. Severe, untreated tonsillitis in children or adults can cause a pocket of pus (abscess) to form near the tonsil.   Your evaluation  A health evaluation can help find the cause of your sore throat. It can also help your healthcare provider choose the best treatment for you. The evaluation may include a health history, physical exam, and diagnostic tests.   Health history  Your healthcare provider may ask you the following:   · How long has the sore throat lasted and how have you been treating it?  · Do you have any other symptoms, such as body aches, fever, or cough?  · Does your sore throat recur? If so, how often? How many days of school or work have you missed because of a sore throat?  · Do you have trouble eating or swallowing?  · Have you been told that you snore or have other sleep problems?  · Do you have bad breath?  · Do you cough up bad-tasting mucus?  Physical exam  During the exam, your healthcare provider checks your ears, nose, and throat for problems. He or she also checks for  swelling in the neck, and may listen to your chest.   Possible tests  Other tests your healthcare provider may perform include:   · A throat swab to check for bacteria such as streptococcus (the bacteria that causes strep throat)  · A blood test to check for mononucleosis (a viral infection)  · A chest X-ray to rule out pneumonia, especially if you have a cough  Treating a sore throat  Treatment depends on many factors. What is the likely cause? Is the problem recent? Does it keep coming back? In many cases, the best thing to do is to treat the symptoms, rest, and let the problem heal itself. Antibiotics may help clear up some bacterial infections. For cases of severe or recurring tonsillitis, the tonsils may need to be removed.   Relieving your symptoms  · Don’t smoke, and stay away from secondhand smoke.  · For children, try throat sprays or frozen ice pops. Adults and older children may try lozenges.  · Drink warm liquids to soothe the throat and help thin mucus. Stay away from alcohol, spicy foods, and acidic drinks such as orange juice. These can irritate the throat.  · Gargle with warm saltwater ( 1 teaspoon of salt to  8 ounces of warm water).  · Use a humidifier   to keep air moist and relieve throat dryness.  · Try over-the-counter pain relievers such as acetaminophen or ibuprofen. Use as directed, and don’t exceed the recommended dose. Don’t give aspirin to children under age18.    Are antibiotics needed?  If your sore throat is due to a bacterial infection, antibiotics may speed healing and prevent complications. Although group A streptococcus (strep throat) is the major treatable infection for a sore throat, strep throat causes only 5% to 15% of sore throats in adults who seek medical care. Most sore throats are caused by cold or flu viruses. And antibiotics don’t treat viral illness. In fact, using antibiotics when they’re not needed may lead to bacteria that are harder to kill. Your healthcare provider  will prescribe antibiotics only if he or she thinks they are likely to help.   If antibiotics are prescribed  Take the medicine exactly as directed. Be sure to finish your prescription even if you’re feeling better. Ask your healthcare provider or pharmacist what side effects are common and what to do about them.   Is surgery needed?  In some cases, tonsils need to be removed. This is often done as outpatient (same-day) surgery. Your healthcare provider may advise removing the tonsils in cases of:   · Several severe bouts of tonsillitis in a year. “Severe” episodes include those that lead to missed days of school or work, or that need to be treated with antibiotics.  · Tonsillitis that causes breathing problems during sleep  · Tonsillitis caused by food particles collecting in pouches in the tonsils (cryptic tonsillitis)  When to call your healthcare provider   Call your healthcare provider immediately if any of the following occur:   · Problems swallowing  · Symptoms worsen, or new symptoms develop.  · Swollen tonsils make breathing difficult.  · The pain is severe enough to keep you from drinking liquids.  · If a skin rash or hives, develops, call your healthcare provider immediately. Any of these could signal an allergic reaction to antibiotics.  · Symptoms don’t improve within a week.  · Symptoms don’t improve within  2 to 3  days of starting antibiotics.  Call 911  Call 911 if any of the following occur:   · Trouble breathing or problems catching your breath may be a medical emergency.  · Skin is blue, purple or gray in color  · Trouble talking  · Feeling dizzy or faint  · Feeling of doom  [employer]Well last reviewed this educational content on 01/10/2018  © 2000-2020 [employer]. [address], [place] [address]. All rights reserved. This information is not intended as a substitute for professional medical care. Always follow your healthcare professional's instructions.

## 2019-06-22 NOTE — Progress Notes (Signed)
Subjective:    Patient ID: Alyssa Torres is a 28 y.o. female.    Patient was seen in clinic.  Patient wore surgical mask.  Provider, after negative Covid results, wore surgical mask and face shield  HPI  50-year-old female presents due to sore throat, body aches, congestion and "difficulty breathing" x3 weeks.  Patient's mother-in-law lives in patient's home who was recently tested positive for strep.  Her 3 daughters tested positive for strep today.  Patient states she may have been exposed to positive Covid contacts 5 days ago.  Denies fever/chills, cough, loss of taste/smell, headache, abdominal pain or N/V/D.  Has not taken anything for her symptoms.  Current daily smoker.  The following portions of the patient's history were reviewed and updated as appropriate: allergies, current medications, past family history, past medical history, past social history, past surgical history and problem list.    Past Medical History:   Diagnosis Date    Seasonal allergic rhinitis      Review of Systems   Constitutional: Negative for chills and fever.   HENT: Positive for congestion and sore throat. Negative for ear pain and trouble swallowing.    Eyes: Negative for discharge and redness.   Respiratory: Negative for cough and shortness of breath.         "Difficulty breathing" occasionally   Gastrointestinal: Negative for abdominal pain, diarrhea, nausea and vomiting.   Musculoskeletal: Positive for myalgias.   Skin: Negative for rash.   Allergic/Immunologic: Negative for environmental allergies and food allergies.   Neurological: Negative for headaches.   Hematological: Negative for adenopathy.         Objective:    BP (!) 133/98    Pulse 97    Temp 98.4 F (36.9 C) (Oral)    Resp 18    Ht 1.727 m (5\' 8" )    Wt 84.8 kg (187 lb)    LMP 06/08/2019 (Approximate)    SpO2 98%    BMI 28.43 kg/m     Physical Exam  Vitals signs and nursing note reviewed.   Constitutional:       General: She is not in acute distress.      Appearance: Normal appearance.   HENT:      Head: Normocephalic and atraumatic.      Right Ear: External ear normal.      Left Ear: External ear normal.      Nose: Nose normal.      Mouth/Throat:      Mouth: Mucous membranes are moist.      Pharynx: Oropharynx is clear.   Eyes:      Conjunctiva/sclera: Conjunctivae normal.   Cardiovascular:      Rate and Rhythm: Normal rate and regular rhythm.   Pulmonary:      Effort: Pulmonary effort is normal.      Breath sounds: Normal breath sounds.   Musculoskeletal: Normal range of motion.   Lymphadenopathy:      Cervical: No cervical adenopathy.   Skin:     General: Skin is warm.   Neurological:      Mental Status: She is alert and oriented to person, place, and time.   Psychiatric:         Mood and Affect: Mood normal.         Behavior: Behavior normal.             Lab Results from today's visit:  Recent Results (from the past 12 hour(s))   [facility] [place] SARS  Coronavirus Antigen FIA POCT    Collection Time: 06/22/19 11:15 AM   Result Value Ref Range    [place] SARS-CoV-2 Ag POCT Negative Negative   POCT Rapid Group A Strep    Collection Time: 06/22/19 11:15 AM   Result Value Ref Range    POCT QC Pass     Rapid Strep A Screen POCT Negative  Negative    Comment       Negative Results should be confirmed by throat Cx to confirm absence of Strep A inf.   POCT INFLUENZA A/B    Collection Time: 06/22/19 11:15 AM   Result Value Ref Range    POCT QC Pass     POCT Rapid Influenza A AG Negative Negative    POCT Rapid Influenza B AG Negative Negative       Radiology Results from today's visit:  No results found.    Assessment and Plan:       [name] was seen today for sore throat.    Diagnoses and all orders for this visit:    Sore throat  -     [facility] SARS Coronavirus Antigen FIA POCT  -     POCT Rapid Group A Strep  -     POCT INFLUENZA A/B    Generalized body aches  -     [facility] SARS Coronavirus Antigen FIA POCT  -     POCT INFLUENZA A/B    Supportive care.  Rest, fluids.   Tylenol/Ibuprofen for fever/pain.  Nasal saline, neti pot, tea w/honey, warm salt water gargles. Follow up with PCP if symptoms persist.  RTC for worsening symptoms. Pt/guardian acknowledges understanding and concurs with plan of care.    BP elevated.  Pt states that it is around her "normal".  Recommend discuss with PCP at next visit.       Parke Poisson, PA-C  [facility]  06/22/2019  1:22 PM

## 2019-06-30 ENCOUNTER — Encounter: Payer: Self-pay | Admitting: Emergency Medicine

## 2019-06-30 ENCOUNTER — Other Ambulatory Visit: Payer: Self-pay

## 2019-06-30 ENCOUNTER — Emergency Department
Admission: EM | Admit: 2019-06-30 | Discharge: 2019-06-30 | Disposition: A | Discharge status: Home / Self Care | Payer: Medicaid Other | Attending: Emergency Medicine | Admitting: Emergency Medicine

## 2019-06-30 ENCOUNTER — Emergency Department: Payer: Medicaid Other

## 2019-06-30 DIAGNOSIS — J45909 Unspecified asthma, uncomplicated: Secondary | ICD-10-CM | POA: Diagnosis not present

## 2019-06-30 DIAGNOSIS — R1031 Right lower quadrant pain: Secondary | ICD-10-CM | POA: Diagnosis not present

## 2019-06-30 DIAGNOSIS — R5383 Other fatigue: Secondary | ICD-10-CM | POA: Diagnosis not present

## 2019-06-30 DIAGNOSIS — F17200 Nicotine dependence, unspecified, uncomplicated: Secondary | ICD-10-CM | POA: Diagnosis not present

## 2019-06-30 HISTORY — DX: Unspecified asthma, uncomplicated: J45.909

## 2019-06-30 LAB — URINALYSIS, COMPLETE (UACMP) WITH MICROSCOPIC
Bacteria, UA: NONE SEEN
Bilirubin Urine: NEGATIVE
Glucose, UA: NEGATIVE mg/dL
Hgb urine dipstick: NEGATIVE
Ketones, ur: NEGATIVE mg/dL
Leukocytes,Ua: NEGATIVE
Nitrite: NEGATIVE
Protein, ur: NEGATIVE mg/dL
Specific Gravity, Urine: 1.024 (ref 1.005–1.030)
pH: 5 (ref 5.0–8.0)

## 2019-06-30 LAB — COMPREHENSIVE METABOLIC PANEL
ALT: 13 U/L (ref 0–44)
AST: 16 U/L (ref 15–41)
Albumin: 4 g/dL (ref 3.5–5.0)
Alkaline Phosphatase: 79 U/L (ref 38–126)
Anion gap: 7 (ref 5–15)
BUN: 11 mg/dL (ref 6–20)
CO2: 25 mmol/L (ref 22–32)
Calcium: 9.1 mg/dL (ref 8.9–10.3)
Chloride: 106 mmol/L (ref 98–111)
Creatinine, Ser: 0.77 mg/dL (ref 0.44–1.00)
GFR calc Af Amer: >60 mL/min (ref >60)
GFR calc non Af Amer: >60 mL/min (ref >60)
Glucose, Bld: 96 mg/dL (ref 70–99)
Potassium: 4 mmol/L (ref 3.5–5.1)
Sodium: 138 mmol/L (ref 135–145)
Total Bilirubin: 0.6 mg/dL (ref 0.3–1.2)
Total Protein: 8.6 g/dL — ABNORMAL HIGH (ref 6.5–8.1)

## 2019-06-30 LAB — POCT PREGNANCY, URINE: Preg Test, Ur: NEGATIVE

## 2019-06-30 LAB — LIPASE, BLOOD: Lipase: 22 U/L (ref 11–51)

## 2019-06-30 LAB — CBC
HCT: 35.8 % — ABNORMAL LOW (ref 36.0–46.0)
Hemoglobin: 10.8 g/dL — ABNORMAL LOW (ref 12.0–15.0)
MCH: 24.5 pg — ABNORMAL LOW (ref 26.0–34.0)
MCHC: 30.2 g/dL (ref 30.0–36.0)
MCV: 81.4 fL (ref 80.0–100.0)
Platelets: 286 10*3/uL (ref 150–400)
RBC: 4.4 MIL/uL (ref 3.87–5.11)
RDW: 15.9 % — ABNORMAL HIGH (ref 11.5–15.5)
WBC: 8.5 10*3/uL (ref 4.0–10.5)
nRBC: 0 % (ref 0.0–0.2)

## 2019-06-30 IMAGING — CT CT ABD-PELV W/ CM
2 of 4 series · 15 of 46 positions shown, 17 images · IV contrast (APPLIED)
Comparison: None.

CLINICAL DATA: Right lower abdominal pain radiating to groin.

EXAM:
CT ABDOMEN AND PELVIS WITH CONTRAST
TECHNIQUE: Multidetector CT imaging of the abdomen and pelvis was performed
using the standard protocol following bolus administration of
intravenous contrast.
CONTRAST:  100mL OMNIPAQUE IOHEXOL 300 MG/ML  SOLN

[Series 2: axial st · axial · 0.81mm/px · z∈[-793,-373]mm · 12 of 92 slices shown, 14 images]
[im 4/92  soft-tissue]
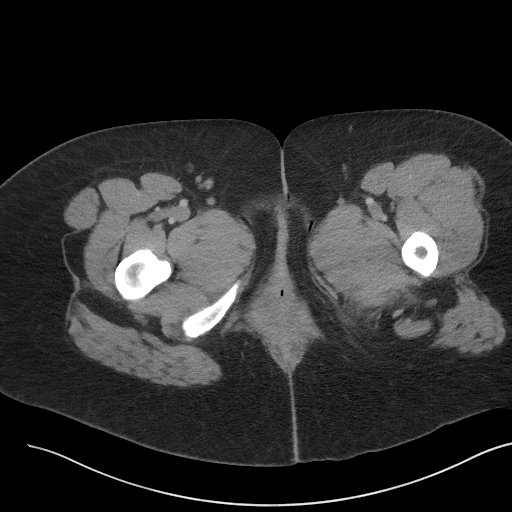
[im 4/92  bone]
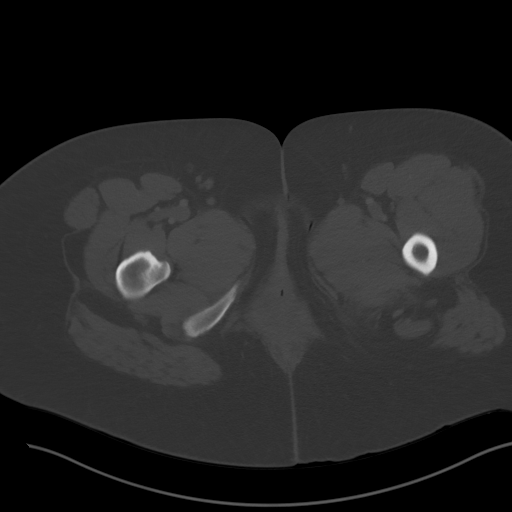
[im 12/92  soft-tissue]
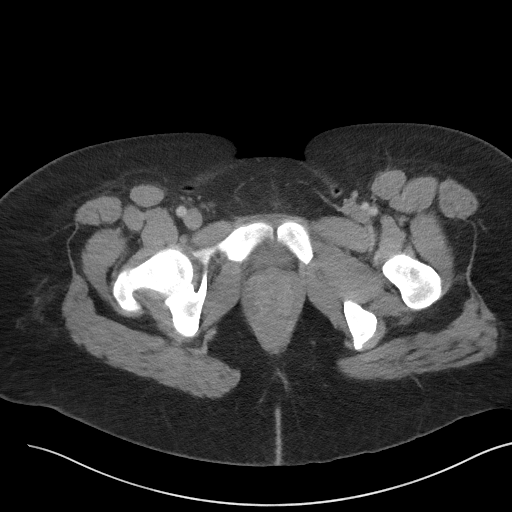
[im 19/92  soft-tissue]
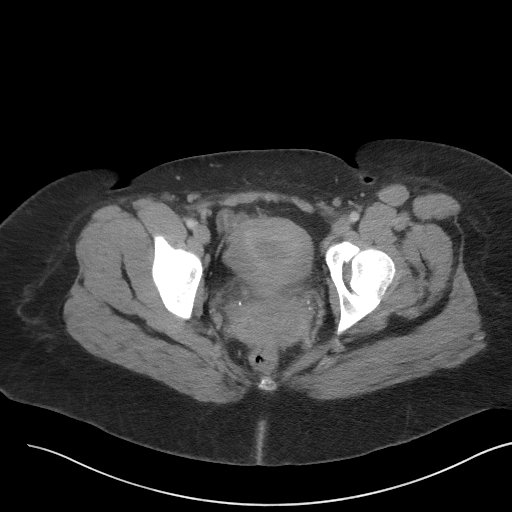
[im 27/92  soft-tissue]
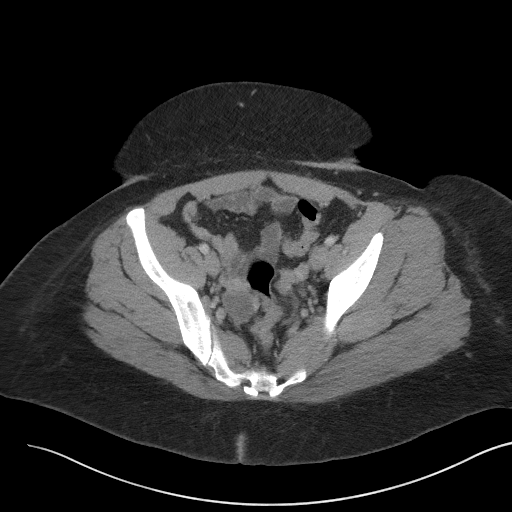
[im 35/92  soft-tissue]
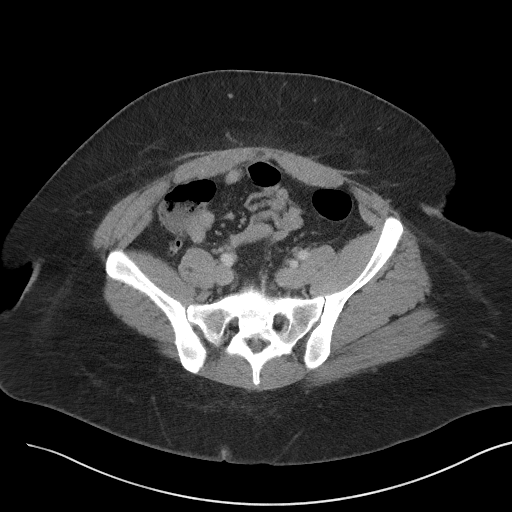
[im 42/92  soft-tissue]
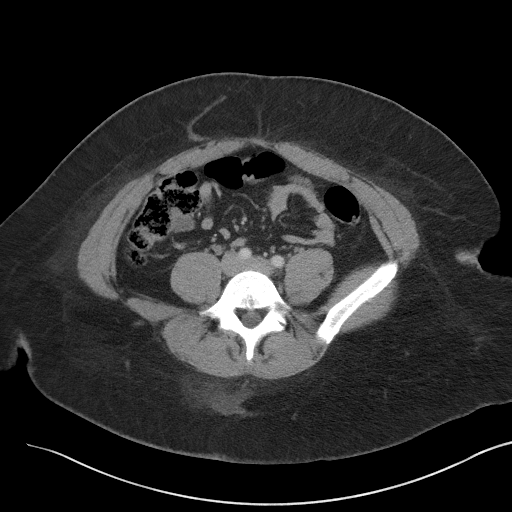
[im 50/92  soft-tissue]
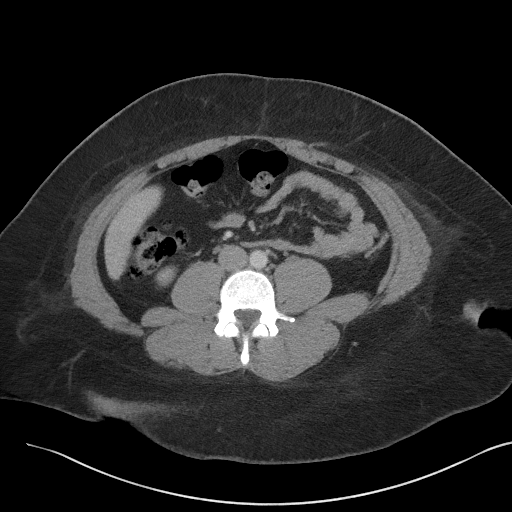
[im 57/92  soft-tissue]
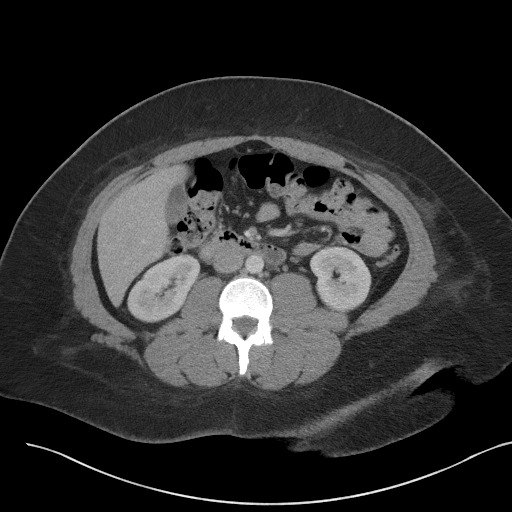
[im 65/92  soft-tissue]
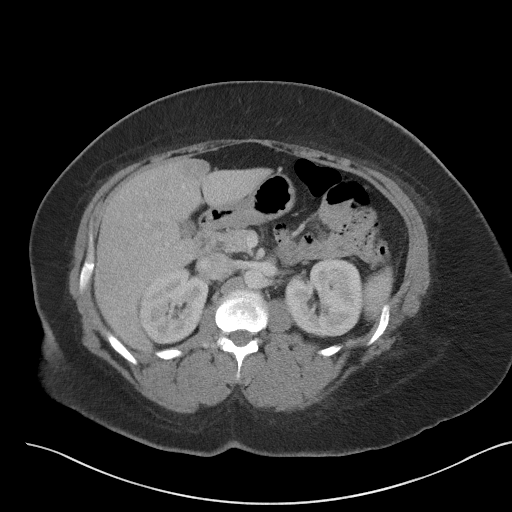
[im 65/92  bone]
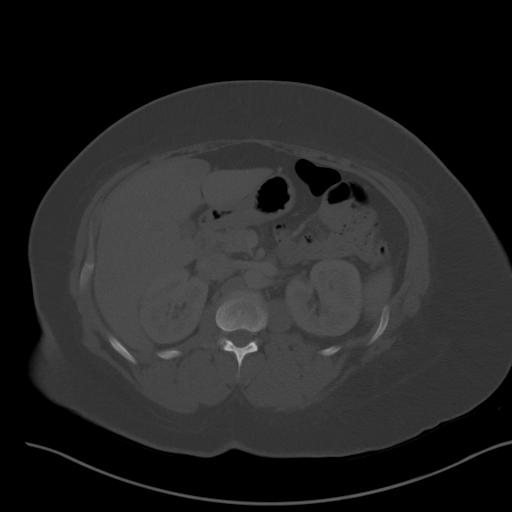
[im 73/92  soft-tissue]
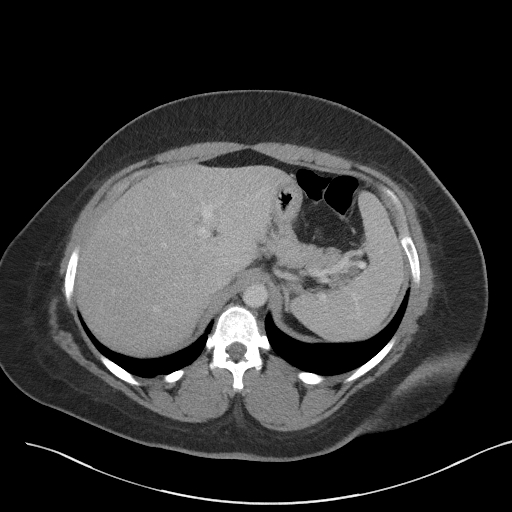
[im 80/92  soft-tissue]
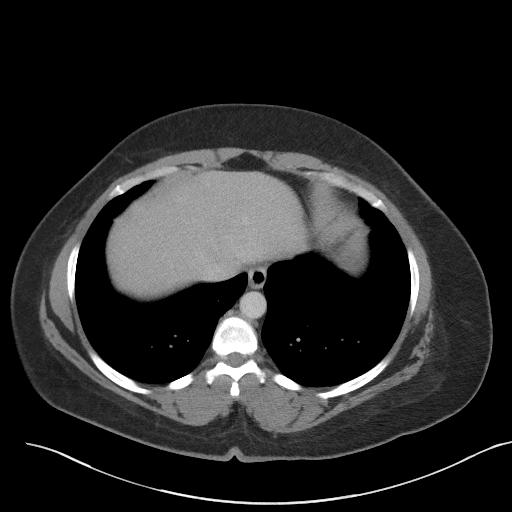
[im 88/92  soft-tissue]
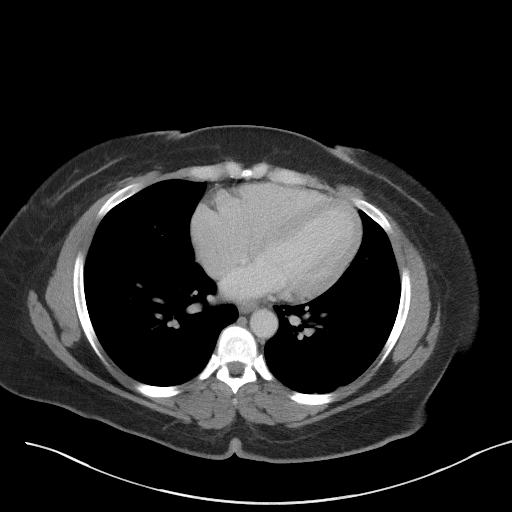

[Series 5: coronal st · coronal · 0.64mm/px · 3 of 79 slices shown]
[im 27/79  soft-tissue]
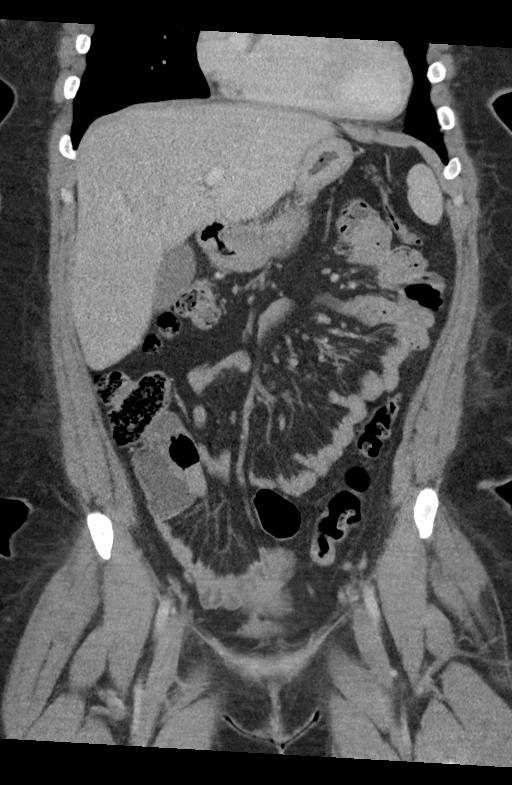
[im 35/79  soft-tissue]
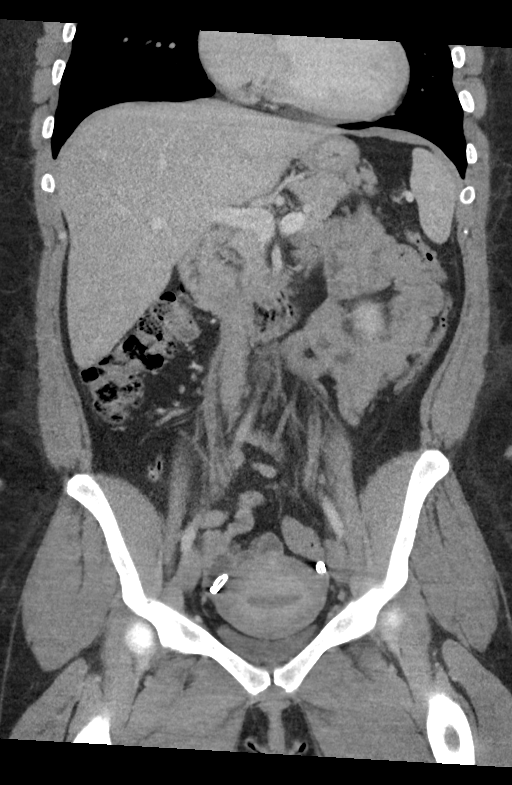
[im 44/79  soft-tissue]
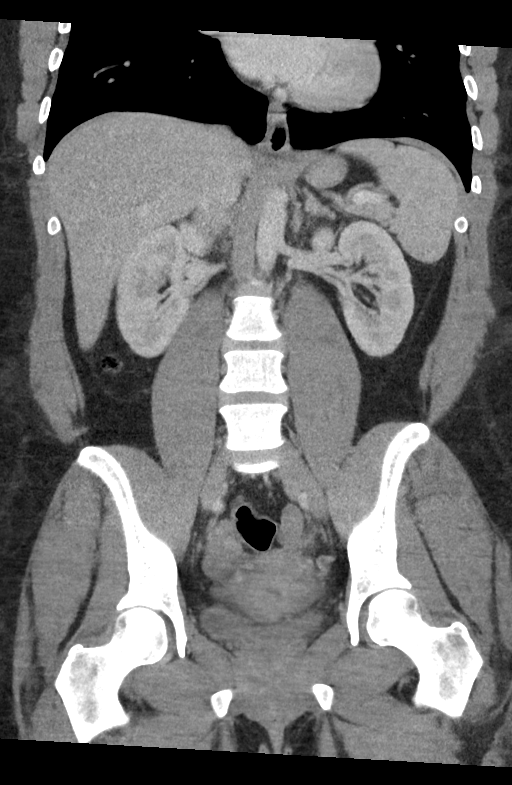

[15 of 46 positions shown; findings below may reference images not displayed]

FINDINGS: Lower chest: The lung bases are clear. The heart size is normal.

Hepatobiliary: The liver is normal. Normal gallbladder.There is no
biliary ductal dilation.

Pancreas: Normal contours without ductal dilatation. No
peripancreatic fluid collection.

Spleen: No splenic laceration or hematoma.

Adrenals/Urinary Tract:

--Adrenal glands: No adrenal hemorrhage.

--Right kidney/ureter: No hydronephrosis or perinephric hematoma.

--Left kidney/ureter: No hydronephrosis or perinephric hematoma.

--Urinary bladder: The bladder is decompressed which limits
evaluation

Stomach/Bowel:

--Stomach/Duodenum: No hiatal hernia or other gastric abnormality.
Normal duodenal course and caliber.

--Small bowel: No dilatation or inflammation.

--Colon: No focal abnormality.

--Appendix: Normal.

Vascular/Lymphatic: Normal course and caliber of the major abdominal
vessels.

--No retroperitoneal lymphadenopathy.

--No mesenteric lymphadenopathy.

--No pelvic or inguinal lymphadenopathy.

Reproductive: The patient is status post prior bilateral tubal
ligation.

Other: No ascites or free air. The abdominal wall is normal.

Musculoskeletal. No acute displaced fractures. There is a 0.9 cm
partially visualized right breast nodule.
IMPRESSION: No acute abdominopelvic abnormality.

There is a partially visualized at least 0.9 cm right breast nodule.
Follow-up with outpatient mammography is recommended.

## 2019-06-30 MED ORDER — KETOROLAC TROMETHAMINE 30 MG/ML IJ SOLN
30.0000 mg | Freq: Once | INTRAMUSCULAR | Status: AC
  Administered 2019-06-30: 30 mg via INTRAVENOUS
  Filled 2019-06-30: qty 1

## 2019-06-30 MED ORDER — MORPHINE SULFATE (PF) 4 MG/ML IV SOLN: 6.0000 mg | Freq: Once | INTRAVENOUS | Status: DC

## 2019-06-30 MED ORDER — IOHEXOL 300 MG/ML  SOLN
100.0000 mL | Freq: Once | INTRAMUSCULAR | Status: AC | PRN
  Administered 2019-06-30: 100 mL via INTRAVENOUS
  Filled 2019-06-30: qty 100

## 2019-06-30 MED ORDER — TRAMADOL HCL 50 MG PO TABS: 50.0000 mg | ORAL_TABLET | Freq: Four times a day (QID) | ORAL | 0 refills | Status: AC | PRN

## 2019-06-30 MED ORDER — ONDANSETRON HCL 4 MG/2ML IJ SOLN: 4.0000 mg | Freq: Once | INTRAMUSCULAR | Status: DC

## 2019-06-30 NOTE — ED Notes (Signed)
Pt ambulatory from triage, reports pain in the lower abdomen on the right side, radiates down right leg and right side of back. States she took tylenol around 1:00 pm and this did help to dull the pain but did not relieve it.

## 2019-06-30 NOTE — ED Notes (Signed)
Dr. Ellender Hose informed that pt is driving herself, will hold zofran and morphine

## 2019-06-30 NOTE — ED Provider Notes (Signed)
  Physical Exam  BP 131/90 (BP Location: Right Arm)   Pulse (!) 58   Temp 98.6 F (37 C) (Oral)   Resp 16   Ht 5' (1.524 m)   Wt 111.1 kg   LMP 06/15/2019   SpO2 100%   BMI 47.85 kg/m   Physical Exam  ED Course/Procedures     Procedures  MDM    Assumed care for Dr. Duffy Bruce. Pelvic ultrasound returned with multiple follicles of the right ovary but no other abnormality.  CT abdomen and pelvis revealed no abnormality.  A right breast nodule was incidentally discovered on CT and patient was briefed regarding results of her imaging.  She was advised to follow-up with OB/GYN regarding pelvic pain and also to undergo a mammogram.  She was discharged with a short course of tramadol.  Return precautions were given.  All patient questions were answered.      Vallarie Mare [place], PA-C 06/30/19 2327    Duffy Bruce, MD 07/03/19 [phone]

## 2019-06-30 NOTE — ED Provider Notes (Signed)
[facility] Emergency Department Provider Note  ____________________________________________   First MD Initiated Contact with Patient 06/30/19 1922     (approximate)  I have reviewed the triage vital signs and the nursing notes.   HISTORY  Chief Complaint Abdominal Pain    HPI Letha Mirabal is a 28 y.o. female here with RLQ and R flank pain. Pt reports that last month, she skipped a period and went 35 days late. She had a normal period last week, but during the period began developing aching, cramping, severe RLQ pain. The pain starts in the R adnexal area and occasionally radiates up to the R flank. Pain is worse with movement, palpation. No alleviating factors. She has a h/o ovarian cysts and endometriosis.        Past Medical History:  Diagnosis Date  . Asthma     There are no problems to display for this patient.   Past Surgical History:  Procedure Laterality Date  . BREAST REDUCTION SURGERY    . CESAREAN SECTION    . surgery to endometrius      Prior to Admission medications   Medication Sig Start Date End Date Taking? Authorizing Provider  traMADol (ULTRAM) 50 MG tablet Take 1 tablet (50 mg total) by mouth every 6 (six) hours as needed for up to 3 days. 06/30/19 07/03/19  Orvil Feil, PA-C    Allergies Patient has no known allergies.  History reviewed. No pertinent family history.  Social History Social History   Tobacco Use  . Smoking status: Current Every Day Smoker  . Smokeless tobacco: Never Used  Substance Use Topics  . Alcohol use: Never  . Drug use: Never    Review of Systems  Review of Systems  Constitutional: Positive for fatigue. Negative for fever.  HENT: Negative for congestion and sore throat.   Eyes: Negative for visual disturbance.  Respiratory: Negative for cough and shortness of breath.   Cardiovascular: Negative for chest pain.  Gastrointestinal: Positive for abdominal pain. Negative for  diarrhea, nausea and vomiting.  Genitourinary: Positive for flank pain.  Musculoskeletal: Negative for back pain and neck pain.  Skin: Negative for rash and wound.  Neurological: Negative for weakness.  All other systems reviewed and are negative.    ____________________________________________  PHYSICAL EXAM:      VITAL SIGNS: ED Triage Vitals  Enc Vitals Group     BP 06/30/19 1800 (!) 156/89     Pulse Rate 06/30/19 1800 (!) 57     Resp 06/30/19 1800 18     Temp 06/30/19 1800 98.6 F (37 C)     Temp Source 06/30/19 1800 Oral     SpO2 06/30/19 1800 100 %     Weight 06/30/19 1758 245 lb (111.1 kg)     Height 06/30/19 1758 5' (1.524 m)     Head Circumference --      Peak Flow --      Pain Score 06/30/19 1758 5     Pain Loc --      Pain Edu? --      Excl. in GC? --      Physical Exam Vitals and nursing note reviewed.  Constitutional:      General: She is not in acute distress.    Appearance: She is well-developed.  HENT:     Head: Normocephalic and atraumatic.  Eyes:     Conjunctiva/sclera: Conjunctivae normal.  Cardiovascular:     Rate and Rhythm: Normal rate and regular  rhythm.     Heart sounds: Normal heart sounds. No murmur. No friction rub.  Pulmonary:     Effort: Pulmonary effort is normal. No respiratory distress.     Breath sounds: Normal breath sounds. No wheezing or rales.  Abdominal:     General: There is no distension.     Palpations: Abdomen is soft.     Tenderness: There is abdominal tenderness in the right lower quadrant and suprapubic area. There is no right CVA tenderness, left CVA tenderness, guarding or rebound.  Musculoskeletal:     Cervical back: Neck supple.  Skin:    General: Skin is warm.     Capillary Refill: Capillary refill takes less than 2 seconds.  Neurological:     Mental Status: She is alert and oriented to person, place, and time.     Motor: No abnormal muscle tone.       ____________________________________________     LABS (all labs ordered are listed, but only abnormal results are displayed)  Labs Reviewed  COMPREHENSIVE METABOLIC PANEL - Abnormal; Notable for the following components:      Result Value   Total Protein 8.6 (*)    All other components within normal limits  CBC - Abnormal; Notable for the following components:   Hemoglobin 10.8 (*)    HCT 35.8 (*)    MCH 24.5 (*)    RDW 15.9 (*)    All other components within normal limits  URINALYSIS, COMPLETE (UACMP) WITH MICROSCOPIC - Abnormal; Notable for the following components:   Color, Urine YELLOW (*)    APPearance HAZY (*)    All other components within normal limits  LIPASE, BLOOD  POC URINE PREG, ED  POCT PREGNANCY, URINE    ____________________________________________  EKG: none ________________________________________  RADIOLOGY All imaging, including plain films, CT scans, and ultrasounds, independently reviewed by me, and interpretations confirmed via formal radiology reads.  ED MD interpretation:   Pelvic U/S: Pending  Official radiology report(s): CT ABDOMEN PELVIS W CONTRAST  Result Date: 06/30/2019 CLINICAL DATA:  Right lower abdominal pain radiating to groin. EXAM: CT ABDOMEN AND PELVIS WITH CONTRAST TECHNIQUE: Multidetector CT imaging of the abdomen and pelvis was performed using the standard protocol following bolus administration of intravenous contrast. CONTRAST:  100mL OMNIPAQUE IOHEXOL 300 MG/ML  SOLN COMPARISON:  None. FINDINGS: Lower chest: The lung bases are clear. The heart size is normal. Hepatobiliary: The liver is normal. Normal gallbladder.There is no biliary ductal dilation. Pancreas: Normal contours without ductal dilatation. No peripancreatic fluid collection. Spleen: No splenic laceration or hematoma. Adrenals/Urinary Tract: --Adrenal glands: No adrenal hemorrhage. --Right kidney/ureter: No hydronephrosis or perinephric hematoma. --Left kidney/ureter: No hydronephrosis or perinephric hematoma. --Urinary  bladder: The bladder is decompressed which limits evaluation Stomach/Bowel: --Stomach/Duodenum: No hiatal hernia or other gastric abnormality. Normal duodenal course and caliber. --Small bowel: No dilatation or inflammation. --Colon: No focal abnormality. --Appendix: Normal. Vascular/Lymphatic: Normal course and caliber of the major abdominal vessels. --No retroperitoneal lymphadenopathy. --No mesenteric lymphadenopathy. --No pelvic or inguinal lymphadenopathy. Reproductive: The patient is status post prior bilateral tubal ligation. Other: No ascites or free air. The abdominal wall is normal. Musculoskeletal. No acute displaced fractures. There is a 0.9 cm partially visualized right breast nodule. IMPRESSION: No acute abdominopelvic abnormality. There is a partially visualized at least 0.9 cm right breast nodule. Follow-up with outpatient mammography is recommended. Electronically Signed   By: Katherine Mantlehristopher  Green M.D.   On: 06/30/2019 22:41   US PELVIC COMPLETE W TRANSVAGINAL AND TORSION R/O  Result Date: 06/30/2019 CLINICAL DATA:  Right lower quadrant pain EXAM: TRANSABDOMINAL AND TRANSVAGINAL ULTRASOUND OF PELVIS DOPPLER ULTRASOUND OF OVARIES TECHNIQUE: Both transabdominal and transvaginal ultrasound examinations of the pelvis were performed. Transabdominal technique was performed for global imaging of the pelvis including uterus, ovaries, adnexal regions, and pelvic cul-de-sac. It was necessary to proceed with endovaginal exam following the transabdominal exam to visualize the uterus, endometrium, ovaries and adnexa. Color and duplex Doppler ultrasound was utilized to evaluate blood flow to the ovaries. COMPARISON:  None. FINDINGS: Uterus Measurements: 10.1 x 5.1 x 6.1 cm = volume: 163 mL. No fibroids or other mass visualized. Endometrium Thickness: 13 mm in thickness.  No focal abnormality visualized. Right ovary Measurements: 3.3 x 1.9 x 2.7 cm. = volume: 9.2 mL. Follicles noted with dominant follicle 1.6  cm. No adnexal mass. Left ovary Measurements: 3.0 x 2.0 x 1.9 cm = volume: 5.7 mL. Normal appearance/no adnexal mass. Pulsed Doppler evaluation of both ovaries demonstrates normal low-resistance arterial and venous waveforms. Other findings No abnormal free fluid. IMPRESSION: Multiple follicles in the right ovary, the largest 1.6 cm. No evidence of torsion. No acute findings. Electronically Signed   By: Rolm Baptise M.D.   On: 06/30/2019 21:45    ____________________________________________  PROCEDURES   Procedure(s) performed (including Critical Care):  Procedures  ____________________________________________  INITIAL IMPRESSION / MDM / [place] / ED COURSE  As part of my medical decision making, I reviewed the following data within the [place] notes reviewed and incorporated, Old chart reviewed, Notes from prior ED visits, and [place] [employer] was evaluated in Emergency Department on 07/01/2019 for the symptoms described in the history of present illness. She was evaluated in the context of the global COVID-19 pandemic, which necessitated consideration that the patient might be at risk for infection with the SARS-CoV-2 virus that causes COVID-19. Institutional protocols and algorithms that pertain to the evaluation of patients at risk for COVID-19 are in a state of rapid change based on information released by regulatory bodies including the CDC and federal and state organizations. These policies and algorithms were followed during the patient's care in the ED.  Some ED evaluations and interventions may be delayed as a result of limited staffing during the pandemic.*     Medical Decision Making:  28 yo F here with R adnexal and flank pain. Given correlation to recent abnormally long menstrual cycle w/ h/o cysts and endometriosis, clinically I suspect ovarian cyst vs endometriosis, less likely torsion.  Appendicitis or colitis much less likely given sx > 5 days with minimal actual RLQ TTP, normal WBC, normal CMP, and no inflammation on UA. No signs of UTI.  Plan to f/u U/S. If U/S shows cyst or other GU etiology, would tx symptomatically (OK with short-term analgesics). If unremarkable U/S, would recommend CT W contrast to eval appendicitis, though less likel. Pt updated and in agreement.  ____________________________________________  FINAL CLINICAL IMPRESSION(S) / ED DIAGNOSES  Final diagnoses:  Right lower quadrant abdominal pain      Duffy Bruce, MD 07/01/19 0015

## 2019-06-30 NOTE — ED Triage Notes (Signed)
Pt here for right lower abdominal pain radiating through groin.  No vaginal discharge or bleeding.  Pain also radiates into back. No hx kidney stones.  Unlabored. VSS. No vomiting or diarrhea. Some nausea.

## 2019-07-11 ENCOUNTER — Other Ambulatory Visit: Payer: Self-pay | Admitting: Family Medicine

## 2019-07-11 DIAGNOSIS — N63 Unspecified lump in unspecified breast: Secondary | ICD-10-CM

## 2019-07-20 ENCOUNTER — Ambulatory Visit
Admission: RE | Admit: 2019-07-20 | Discharge: 2019-07-20 | Disposition: A | Discharge status: Home / Self Care | Payer: Medicaid Other | Source: Ambulatory Visit | Attending: Family Medicine | Admitting: Family Medicine

## 2019-07-20 DIAGNOSIS — N6310 Unspecified lump in the right breast, unspecified quadrant: Secondary | ICD-10-CM | POA: Insufficient documentation

## 2019-07-20 DIAGNOSIS — N63 Unspecified lump in unspecified breast: Secondary | ICD-10-CM

## 2019-07-25 NOTE — Telephone Encounter (Signed)
If the family can call the quit line, they calculate out how to dose the nicotine patch, alternative treatment, and the insurance coverage.   Sorry - I thought I gave that information to them already.   If they are having trouble, I can write for the nicotine patch, but insurance seems to work out better if you go through the quit line then they sometimes let us know if we also need to write a prescription.

## 2019-08-17 ENCOUNTER — Encounter: Payer: Medicaid Other | Attending: Obstetrics and Gynecology | Admitting: Skilled Nursing Facility1

## 2019-08-17 ENCOUNTER — Encounter: Payer: Self-pay | Admitting: Skilled Nursing Facility1

## 2019-08-17 ENCOUNTER — Other Ambulatory Visit: Payer: Self-pay

## 2019-08-17 DIAGNOSIS — Z6841 Body Mass Index (BMI) 40.0 and over, adult: Secondary | ICD-10-CM | POA: Diagnosis not present

## 2019-08-17 DIAGNOSIS — Z713 Dietary counseling and surveillance: Secondary | ICD-10-CM | POA: Insufficient documentation

## 2019-08-17 DIAGNOSIS — E669 Obesity, unspecified: Secondary | ICD-10-CM

## 2019-08-17 NOTE — Progress Notes (Signed)
Assessment:  Primary concerns today: obesity.   Pt states she is allergic to shell fish and tomatoes. Pt states she does have depression and anxiety which she feels are controlled. 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  Pt states she just moved here from [place].  Pt states she does want to find a therapist. Pt sates since moving she has mentally felt much better.  Pt states she has been tying to lose weight for 5 years having 2 children in that 5 years and having lost 12 pounds in the last month. Pt states she has insomnia and does not go to sleep until 2am. Pt states if she is working she only eats every other day and does not eat on days she works (currently does not have a job). Pt states she has 3 children and they are a big stressor for her and their dad lives with them and he Is another point of stress. Pt states she is not with their father so he is a little less stressful to her.  Pt states her usual weight as an adult was 285 pounds.  Pt states she has a bowel movement once every 2 days with strain.  Pt states she has been fatigued making it hard to be physically active.  Pt states she struggles with headaches and has restless leg syndrome.  Pt states she does smoke cigarettes. Pt states she smokes maryjuana daily and if she did not she would cry daily and be extremely irritable.  Pt states she struggles with no appetite and if it was not for the maryjuana she would not eat at all.  Pt suspects she is allergic to peanut butter an itchy throat reaction.  Pt states she does not like non starchy vegetables.  Pt states she gets a burst of energy when she takes vitamin b12.    Pt seems to struggle with her children the most, bringing them up as a stressor most often.   Pt has a good understanding of balanced meals and appropriate portion sizes.    Labs: Hemoglobin 10.8 HCT 35.8  BodyComp Scale Body water: 81.1 pounds Percent body fat 55.1  MEDICATIONS: none    DIETARY INTAKE:  Usual eating pattern includes 2 meals and 3 snacks per day.  Everyday foods include none stated.  Avoided foods include none stated.    24-hr recall:  B ( AM): fruit or juice or chocolate milk  Snk ( AM):  L ( 12PM): sandwich or frozen meal Snk ( PM): chips or prezels or chex mix or fig bars or nilla wafers D ( PM): baked chicken corn and green beans or fish and mashed potatoes  Snk ( PM): fruit popcicle  Beverages: alkaline water, and other bottled water   Usual physical activity: ADL's  Intervention:  Nutrition counseling. Dietitian educated pt on proper supplementation for her anemia labs, proper supplementation for being a smoker, educated pt on reasons for quitting smoking, and educated pt on whole grains. Dietitian educated the pt on the importance of consuming non starchy vegetables but pt states she is not ready for that change.  Goals: -Start drinking out of your faucet: try lemon juice, lime juice, cinnamon, mint leaf, basil whatever you think would taste good: aiming for 64 fluid ounces  -Start an iron supplement: Iron supplement 45-65mg  taken with 500mg  Vitamin C daily  -Try equate protein shake with fruit for breakfast or an egg with fruit -Choose brown rice, whole wheat pasta, or  whole wheat bread -start thinking about being open minded about non starchy vegetables  -Aim to do your exercises 3 times a week -Avoid juice, diet juice is okay  Teaching Method Utilized:  Visual Auditory Hands on  Handouts given during visit include:  myplate  Barriers to learning/adherence to lifestyle change:   Demonstrated degree of understanding via:  Teach Back   Monitoring/Evaluation:  Dietary intake, exercise, weight

## 2019-09-07 ENCOUNTER — Ambulatory Visit: Payer: Medicaid Other | Admitting: Skilled Nursing Facility1

## 2020-03-30 ENCOUNTER — Inpatient Hospital Stay
Admit: 2020-03-30 | Discharge: 2020-03-31 | Disposition: A | Discharge status: Home / Self Care | Payer: BLUE CROSS/BLUE SHIELD | Attending: Emergency Medicine

## 2020-03-30 ENCOUNTER — Emergency Department: Admit: 2020-03-30 | Payer: BLUE CROSS/BLUE SHIELD

## 2020-03-30 DIAGNOSIS — S0101XA Laceration without foreign body of scalp, initial encounter: Secondary | ICD-10-CM

## 2020-03-30 MED ORDER — HYDROCODONE-ACETAMINOPHEN 7.5 MG-325 MG TAB
Freq: Once | ORAL | Status: AC
Start: 2020-03-30 — End: 2020-03-30
  Administered 2020-03-31: via ORAL

## 2020-03-30 MED FILL — HYDROCODONE-ACETAMINOPHEN 7.5 MG-325 MG TAB: ORAL | Qty: 1
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
## 2020-03-30 NOTE — ED Notes (Signed)
Discharged home to self.  Ambulatory out of ED.  VS WDL.  0 s/s acute distress.  Respirations even and unlabored.  Discharge instructions and follow up care reviewed.  Patient receptive and demonstrated knowledge of instruction via teach-back method.
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
## 2020-03-30 NOTE — ED Notes (Signed)
Wound cleaned by Dr. Mallie Mussel using dermal wound cleanser.
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
## 2020-04-24 ENCOUNTER — Other Ambulatory Visit: Payer: Self-pay

## 2020-04-24 ENCOUNTER — Other Ambulatory Visit: Payer: PRIVATE HEALTH INSURANCE | Attending: Family

## 2020-04-24 DIAGNOSIS — Z20828 Contact with and (suspected) exposure to other viral communicable diseases: Secondary | ICD-10-CM

## 2020-04-24 DIAGNOSIS — Z20822 Contact with and (suspected) exposure to covid-19: Secondary | ICD-10-CM | POA: Insufficient documentation

## 2020-04-25 LAB — COVID-19 PANTHER - LAB USE ONLY: SARS-CoV-2: NOT DETECTED

## 2020-05-07 ENCOUNTER — Other Ambulatory Visit: Payer: Self-pay

## 2020-05-07 ENCOUNTER — Emergency Department
Admission: EM | Admit: 2020-05-07 | Discharge: 2020-05-07 | Disposition: A | Discharge status: Home / Self Care | Payer: Medicaid Other | Attending: Emergency Medicine | Admitting: Emergency Medicine

## 2020-05-07 ENCOUNTER — Encounter: Payer: Self-pay | Admitting: Emergency Medicine

## 2020-05-07 DIAGNOSIS — J020 Streptococcal pharyngitis: Secondary | ICD-10-CM | POA: Insufficient documentation

## 2020-05-07 DIAGNOSIS — J029 Acute pharyngitis, unspecified: Secondary | ICD-10-CM | POA: Diagnosis present

## 2020-05-07 DIAGNOSIS — J45909 Unspecified asthma, uncomplicated: Secondary | ICD-10-CM | POA: Diagnosis not present

## 2020-05-07 DIAGNOSIS — Z20822 Contact with and (suspected) exposure to covid-19: Secondary | ICD-10-CM | POA: Insufficient documentation

## 2020-05-07 DIAGNOSIS — F17200 Nicotine dependence, unspecified, uncomplicated: Secondary | ICD-10-CM | POA: Diagnosis not present

## 2020-05-07 LAB — GROUP A STREP BY PCR: Group A Strep by PCR: DETECTED — AB

## 2020-05-07 MED ORDER — AMOXICILLIN 250 MG/5ML PO SUSR
500.0000 mg | Freq: Once | ORAL | Status: AC
  Administered 2020-05-07: 500 mg via ORAL
  Filled 2020-05-07: qty 10

## 2020-05-07 MED ORDER — AMOXICILLIN 400 MG/5ML PO SUSR: 800.0000 mg | Freq: Two times a day (BID) | ORAL | 0 refills | Status: AC

## 2020-05-07 MED ORDER — DEXAMETHASONE 10 MG/ML FOR PEDIATRIC ORAL USE
10.0000 mg | Freq: Once | INTRAMUSCULAR | Status: AC
  Administered 2020-05-07: 10 mg via ORAL
  Filled 2020-05-07: qty 1

## 2020-05-07 NOTE — ED Triage Notes (Signed)
Pt to ED from home c/o fever and sore throat since Sunday.  Last took liquid tylenol today around 1430.  Denies cough.  Chest rise even and unlabored, speaking in complete and coherent sentences, in NAD at this time.

## 2020-05-07 NOTE — ED Provider Notes (Signed)
Emergency Department Provider Note  ____________________________________________  Time seen: Approximately 7:29 PM  I have reviewed the triage vital signs and the nursing notes.   HISTORY  Chief Complaint Fever and Sore Throat   Historian Patient    HPI Deborah Hicks is a 29 y.o. female presents to the emergency department with pharyngitis for the past 2 to 3 days. Patient states that is been difficult for her to swallow and she has been spitting in her bathroom. She denies anterior neck pain or pain underneath the tongue. No associated rhinorrhea, nasal congestion or nonproductive cough. She has had recent unprotected oral sex with a new partner and is unsure about her STD risk. She denies history of peritonsillar retropharyngeal abscesses. She has had low-grade fever at home. No other alleviating measures have been attempted.   Past Medical History:  Diagnosis Date  . Anxiety   . Asthma   . Depression      Immunizations up to date:  Yes.     Past Medical History:  Diagnosis Date  . Anxiety   . Asthma   . Depression     There are no problems to display for this patient.   Past Surgical History:  Procedure Laterality Date  . BREAST REDUCTION SURGERY    . CESAREAN SECTION    . surgery to endometrius      Prior to Admission medications   Medication Sig Start Date End Date Taking? Authorizing Provider  amoxicillin (AMOXIL) 400 MG/5ML suspension Take 10 mLs (800 mg total) by mouth 2 (two) times daily for 10 days. 05/07/20 05/17/20  Orvil Feil, PA-C    Allergies Iodine and Shellfish allergy  History reviewed. No pertinent family history.  Social History Social History   Tobacco Use  . Smoking status: Current Every Day Smoker  . Smokeless tobacco: Never Used  Substance Use Topics  . Alcohol use: Never  . Drug use: Never     Review of Systems  Constitutional: No fever/chills Eyes:  No discharge ENT: Patient has pharyngitis.  Respiratory:  no cough. No SOB/ use of accessory muscles to breath Gastrointestinal:   No nausea, no vomiting.  No diarrhea.  No constipation. Musculoskeletal: Negative for musculoskeletal pain. Skin: Negative for rash, abrasions, lacerations, ecchymosis.   ____________________________________________   PHYSICAL EXAM:  VITAL SIGNS: ED Triage Vitals  Enc Vitals Group     BP 05/07/20 1911 110/68     Pulse Rate 05/07/20 1911 77     Resp 05/07/20 1911 14     Temp 05/07/20 1911 99.9 F (37.7 C)     Temp Source 05/07/20 1911 Oral     SpO2 05/07/20 1911 100 %     Weight 05/07/20 1912 225 lb (102.1 kg)     Height 05/07/20 1912 5' (1.524 m)     Head Circumference --      Peak Flow --      Pain Score 05/07/20 1912 10     Pain Loc --      Pain Edu? --      Excl. in GC? --      Constitutional: Alert and oriented. Well appearing and in no acute distress. Eyes: Conjunctivae are normal. PERRL. EOMI. Head: Atraumatic. ENT:      Ears: TMs are pearly.       Nose: No congestion/rhinnorhea.      Mouth/Throat: Mucous membranes are moist. Posterior pharynx is erythematous with tonsillar exudate. Tonsils appear symmetrical. Uvula is midline. Neck: No stridor. FROM.  Hematological/Lymphatic/Immunilogical:  Palpable cervical lymphadenopathy. Cardiovascular: Normal rate, regular rhythm. Normal S1 and S2.  Good peripheral circulation. Respiratory: Normal respiratory effort without tachypnea or retractions. Lungs CTAB. Good air entry to the bases with no decreased or absent breath sounds Gastrointestinal: Bowel sounds x 4 quadrants. Soft and nontender to palpation. No guarding or rigidity. No distention. Musculoskeletal: Full range of motion to all extremities. No obvious deformities noted Neurologic:  Normal for age. No gross focal neurologic deficits are appreciated.  Skin:  Skin is warm, dry and intact. No rash noted. Psychiatric: Mood and affect are normal for age. Speech and behavior are normal.    ____________________________________________   LABS (all labs ordered are listed, but only abnormal results are displayed)  Labs Reviewed  GROUP A STREP BY PCR - Abnormal; Notable for the following components:      Result Value   Group A Strep by PCR DETECTED (*)    All other components within normal limits  RESPIRATORY PANEL BY RT PCR (FLU A&B, COVID)  CHLAMYDIA/NGC RT PCR ([facility] ONLY)   ____________________________________________  EKG   ____________________________________________  RADIOLOGY   No results found.  ____________________________________________    PROCEDURES  Procedure(s) performed:     Procedures     Medications  dexamethasone (DECADRON) 10 MG/ML injection for Pediatric ORAL use 10 mg (10 mg Oral Given 05/07/20 1954)  amoxicillin (AMOXIL) 250 MG/5ML suspension 500 mg (500 mg Oral Given 05/07/20 2213)     ____________________________________________   INITIAL IMPRESSION / ASSESSMENT AND PLAN / ED COURSE  Pertinent labs & imaging results that were available during my care of the patient were reviewed by me and considered in my medical decision making (see chart for details).      Assessment and plan Pharyngitis 29 year old female presents to the emergency department with fever and sore throat for the past 2 to 3 days.  Patient had low-grade fever at triage but vital signs were otherwise reassuring.  Group A strep testing was positive.  Patient was started on amoxicillin.  She was discharged with amoxicillin.  Tylenol and ibuprofen alternating were recommended for fever and pharyngitis.  Patient was given oral Decadron prior to discharge and she noted that her pharyngitis had improved significantly.  Return precautions were given to return with new or worsening symptoms.  ____________________________________________  FINAL CLINICAL IMPRESSION(S) / ED DIAGNOSES  Final diagnoses:  Strep throat      NEW MEDICATIONS STARTED  DURING THIS VISIT:  ED Discharge Orders         Ordered    amoxicillin (AMOXIL) 400 MG/5ML suspension  2 times daily        05/07/20 2205              This chart was dictated using voice recognition software/Dragon. Despite best efforts to proofread, errors can occur which can change the meaning. Any change was purely unintentional.     Orvil Feil, PA-C 05/07/20 2258    Shaune Pollack, MD 05/19/20 [phone]

## 2020-05-07 NOTE — Discharge Instructions (Signed)
Take amoxicillin twice daily for the next 10 days. 

## 2020-05-07 NOTE — ED Notes (Signed)
Covid, Flu, Strep, GC swabs taken to lab.

## 2020-05-08 LAB — RESPIRATORY PANEL BY RT PCR (FLU A&B, COVID)
Influenza A by PCR: NEGATIVE
Influenza B by PCR: NEGATIVE
SARS Coronavirus 2 by RT PCR: POSITIVE — AB

## 2020-05-08 NOTE — ED Notes (Signed)
Called patient and informed that COVID +.  Understanding verbalized.  Discussed treating symptoms:  Fever, body aches, etc.

## 2020-05-09 ENCOUNTER — Telehealth: Payer: Self-pay | Admitting: Physician Assistant

## 2020-05-09 ENCOUNTER — Encounter: Payer: Self-pay | Admitting: Physician Assistant

## 2020-05-09 NOTE — Telephone Encounter (Signed)
Called to discuss with patient about Covid symptoms and the use of bamlanivimab/etesevimab or casirivimab/imdevimab, a monoclonal antibody infusion for those with mild to moderate Covid symptoms and at a high risk of hospitalization.  Pt is qualified for this infusion at the [facility] due to; Specific high risk criteria : BMI > 25 and Chronic Lung Disease   Message left to call back our hotline [phone]. Also sent my chart message .  Cline Crock PA-C  [facility]

## 2020-11-27 ENCOUNTER — Emergency Department
Admission: EM | Admit: 2020-11-27 | Discharge: 2020-11-27 | Disposition: A | Discharge status: Home / Self Care | Payer: Medicaid Other | Attending: Emergency Medicine | Admitting: Emergency Medicine

## 2020-11-27 ENCOUNTER — Other Ambulatory Visit: Payer: Self-pay

## 2020-11-27 DIAGNOSIS — L739 Follicular disorder, unspecified: Secondary | ICD-10-CM | POA: Insufficient documentation

## 2020-11-27 DIAGNOSIS — J45909 Unspecified asthma, uncomplicated: Secondary | ICD-10-CM | POA: Diagnosis not present

## 2020-11-27 DIAGNOSIS — F17200 Nicotine dependence, unspecified, uncomplicated: Secondary | ICD-10-CM | POA: Diagnosis not present

## 2020-11-27 DIAGNOSIS — R220 Localized swelling, mass and lump, head: Secondary | ICD-10-CM | POA: Diagnosis present

## 2020-11-27 MED ORDER — NAPROXEN 500 MG PO TABS: 500.0000 mg | ORAL_TABLET | Freq: Two times a day (BID) | ORAL | 0 refills | Status: AC

## 2020-11-27 MED ORDER — SULFAMETHOXAZOLE-TRIMETHOPRIM 800-160 MG PO TABS: 1.0000 | ORAL_TABLET | Freq: Two times a day (BID) | ORAL | 0 refills | Status: AC

## 2020-11-27 MED ORDER — OXYCODONE-ACETAMINOPHEN 7.5-325 MG PO TABS: 1.0000 | ORAL_TABLET | Freq: Four times a day (QID) | ORAL | 0 refills | Status: AC | PRN

## 2020-11-27 NOTE — ED Triage Notes (Signed)
Pt comes with c/o knot to back of head. Pt states she noticed it last Thursday. Pt states it is painful to lay on. Pt denies any drainage.

## 2020-11-27 NOTE — ED Provider Notes (Signed)
[facility] Emergency Department Provider Note   ____________________________________________   None    (approximate)  I have reviewed the triage vital signs and the nursing notes.   HISTORY  Chief Complaint Knot on back of head    HPI Deborah Hicks is a 30 y.o. female patient presents for painful knot on the back of her head for approximately 6 days.  Patient denies any provocative incident for complaint.  Patient is tender to touch.  Patient denies drainage from lesion.  Rates pain as a 4/10.  Described pain as "sore".  No palliative measure for complaint.         Past Medical History:  Diagnosis Date  . Anxiety   . Asthma   . Depression   . Morbid obesity [facility])     Patient Active Problem List   Diagnosis Date Noted  . Morbid obesity (HCC)     Past Surgical History:  Procedure Laterality Date  . BREAST REDUCTION SURGERY    . CESAREAN SECTION    . surgery to endometrius      Prior to Admission medications   Medication Sig Start Date End Date Taking? Authorizing Provider  naproxen (NAPROSYN) 500 MG tablet Take 1 tablet (500 mg total) by mouth 2 (two) times daily with a meal. 11/27/20  Yes Joni Reining, PA-C  oxyCODONE-acetaminophen (PERCOCET) 7.5-325 MG tablet Take 1 tablet by mouth every 6 (six) hours as needed for severe pain. 11/27/20  Yes Joni Reining, PA-C  sulfamethoxazole-trimethoprim (BACTRIM DS) 800-160 MG tablet Take 1 tablet by mouth 2 (two) times daily. 11/27/20  Yes Joni Reining, PA-C    Allergies Iodine and Shellfish allergy  No family history on file.  Social History Social History   Tobacco Use  . Smoking status: Current Every Day Smoker  . Smokeless tobacco: Never Used  Substance Use Topics  . Alcohol use: Never  . Drug use: Never    Review of Systems  Constitutional: No fever/chills Eyes: No visual changes. ENT: No sore throat. Cardiovascular: Denies chest pain. Respiratory: Denies  shortness of breath. Gastrointestinal: No abdominal pain.  No nausea, no vomiting.  No diarrhea.  No constipation. Genitourinary: Negative for dysuria. Musculoskeletal: Negative for back pain. Skin: Negative for rash.  Papular lesion inferior cervical area scalp Neurological: Negative for headaches, focal weakness or numbness. Psychiatric: Anxiety and depression  Allergic/Immunilogical: Iodine and shellfish ____________________________________________   PHYSICAL EXAM:  VITAL SIGNS: ED Triage Vitals  Enc Vitals Group     BP 11/27/20 1111 (!) 158/94     Pulse Rate 11/27/20 1111 (!) 57     Resp 11/27/20 1111 19     Temp 11/27/20 1111 97.7 F (36.5 C)     Temp Source 11/27/20 1111 Oral     SpO2 11/27/20 1111 100 %     Weight --      Height --      Head Circumference --      Peak Flow --      Pain Score 11/27/20 1109 4     Pain Loc --      Pain Edu? --      Excl. in GC? --     Constitutional: Alert and oriented. Well appearing and in no acute distress. Cardiovascular: Normal rate, regular rhythm. Grossly normal heart sounds.  Good peripheral circulation.  Elevated blood pressure. Respiratory: Normal respiratory effort.  No retractions. Lungs CTAB. Gastrointestinal: Soft and nontender. No distention. No abdominal bruits. No CVA tenderness.  Genitourinary: Deferred Neurologic:  Normal speech and language. No gross focal neurologic deficits are appreciated. No gait instability. Skin:  Skin is warm, dry and intact.  Papular lesion on erythematous base for occipital area of scalp.   Psychiatric: Mood and affect are normal. Speech and behavior are normal.  ____________________________________________   LABS (all labs ordered are listed, but only abnormal results are displayed)  Labs Reviewed - No data to display ____________________________________________  EKG   ____________________________________________  RADIOLOGY I, Joni Reining, personally viewed and evaluated  these images (plain radiographs) as part of my medical decision making, as well as reviewing the written report by the radiologist.  ED MD interpretation:    Official radiology report(s): No results found.  ____________________________________________   PROCEDURES  Procedure(s) performed (including Critical Care):  Procedures   ____________________________________________   INITIAL IMPRESSION / ASSESSMENT AND PLAN / ED COURSE  As part of my medical decision making, I reviewed the following data within the electronic MEDICAL RECORD NUMBER         Patient presents with a painful nodule lesion with satellite papular lesion to the inferior occipital area of scalp.  Patient complaint physical exam consistent with folliculitis.  Patient given discharge care instruction advised take medication as directed.  Advised follow-up with dermatology if no improvement in 1 week.      ____________________________________________   FINAL CLINICAL IMPRESSION(S) / ED DIAGNOSES  Final diagnoses:  Folliculitis     ED Discharge Orders         Ordered    sulfamethoxazole-trimethoprim (BACTRIM DS) 800-160 MG tablet  2 times daily        11/27/20 1316    oxyCODONE-acetaminophen (PERCOCET) 7.5-325 MG tablet  Every 6 hours PRN        11/27/20 1316    naproxen (NAPROSYN) 500 MG tablet  2 times daily with meals        11/27/20 1316          *Please note:  Deborah Hicks was evaluated in Emergency Department on 11/27/2020 for the symptoms described in the history of present illness. She was evaluated in the context of the global COVID-19 pandemic, which necessitated consideration that the patient might be at risk for infection with the SARS-CoV-2 virus that causes COVID-19. Institutional protocols and algorithms that pertain to the evaluation of patients at risk for COVID-19 are in a state of rapid change based on information released by regulatory bodies including the CDC and federal and state  organizations. These policies and algorithms were followed during the patient's care in the ED.  Some ED evaluations and interventions may be delayed as a result of limited staffing during and the pandemic.*   Note:  This document was prepared using Dragon voice recognition software and may include unintentional dictation errors.    Aidee, Latimore, PA-C 11/27/20 1320    Dionne Bucy, MD 11/27/20 1323

## 2020-11-27 NOTE — ED Notes (Signed)
Provided DC instructions. Verbalized understanding.  

## 2020-11-27 NOTE — Discharge Instructions (Signed)
Read and follow discharge care instructions.  Take medication as directed.  Follow-up with dermatology if no improvement in 1 week.

## 2020-12-06 ENCOUNTER — Other Ambulatory Visit: Payer: Self-pay

## 2021-09-12 ENCOUNTER — Encounter

## 2021-09-12 ENCOUNTER — Inpatient Hospital Stay: Admit: 2021-09-12 | Payer: PRIVATE HEALTH INSURANCE

## 2021-09-12 DIAGNOSIS — M25551 Pain in right hip: Secondary | ICD-10-CM

## 2021-10-31 ENCOUNTER — Ambulatory Visit: Payer: MEDICAID | Attending: Sports Medicine

## 2021-11-10 ENCOUNTER — Ambulatory Visit: Payer: MEDICARE | Attending: Sports Medicine

## 2021-11-12 ENCOUNTER — Encounter (INDEPENDENT_AMBULATORY_CARE_PROVIDER_SITE_OTHER): Payer: Self-pay | Admitting: Physician Assistant

## 2021-11-12 ENCOUNTER — Other Ambulatory Visit: Payer: Self-pay

## 2021-11-12 ENCOUNTER — Ambulatory Visit (INDEPENDENT_AMBULATORY_CARE_PROVIDER_SITE_OTHER): Payer: BC Managed Care – PPO | Admitting: Physician Assistant

## 2021-11-12 VITALS — BP 128/72 | HR 99 | Temp 97.7°F | Resp 16 | Wt 203.5 lb

## 2021-11-12 DIAGNOSIS — L237 Allergic contact dermatitis due to plants, except food: Secondary | ICD-10-CM

## 2021-11-12 MED ORDER — PREDNISONE 20 MG TABLET
ORAL_TABLET | ORAL | 0 refills | Status: DC
Start: 2021-11-12 — End: 2022-10-12

## 2021-11-12 NOTE — Nursing Note (Signed)
BP 128/72   Pulse 99   Temp 36.5 C (97.7 F) (Tympanic)   Resp 16   Wt 92.3 kg (203 lb 8 oz)   LMP 10/17/2021 (Approximate)   SpO2 100%   BMI 32.60 kg/m     Maryelizabeth Rowan, LPN  03/19/2951, 84:13

## 2021-11-12 NOTE — Progress Notes (Signed)
[facility]  [facility]  [address]  [place] [address]  Dept: [phone]  Dept Fax: [phone]  Loc: [phone]  Loc Fax: [phone]     Malachy Mood  Date of Service: 11/12/2021    Chief complaint:   Chief Complaint   Patient presents with   . Rash     Left lower arm. Was working outside of Saturday.      Subjective  Tazaria Dlugosz is a 31 y.o. female presenting to clinic today c/o rash on the arms and abdomen for the last 4 days. The rash is itchy, no bleeding or discharge. It is worse with time, better with nothing. Possible exposure to poison ivy dermatitis.     ROS: no fever, denies headaches, dizziness, chest pain or shortness of breath, denies black or bloody stools, weakness or difficulty with ambulation, +skin rash or lesion.  All other systems were reviewed and found negative.      Allergies   Allergen Reactions   . Gabapentin Rash and Swelling   . Imitrex [Sumatriptan Succinate] Rash   . Other Rash     Bleach     Patient Active Problem List   Diagnosis   . ASCUS on Pap smear for follow-up postpartum   . Chlamydia infection, current pregnancy, treated   . Hx MRSA infection   . Tobacco use   . Obesity   . Fatigue     Family Medical History:    None         BIOTIN ORAL, Take by mouth  buPROPion (WELLBUTRIN SR) 150 mg Oral Tablet Sustained Release 12 hr, Take 1 Tab (150 mg total) by mouth Twice daily (Patient not taking: Reported on 11/12/2021)  multivit with calcium,iron,min [facility] MULTIPLE VITAMINS ORAL), Take by mouth    No facility-administered medications prior to visit.      Objective  Vitals: BP 128/72   Pulse 99   Temp 36.5 C (97.7 F) (Tympanic)   Resp 16   Wt 92.3 kg (203 lb 8 oz)   LMP 10/17/2021 (Approximate)   SpO2 100%   BMI 32.60 kg/m       General: no distress  Eyes: Conjunctiva clear., Pupils equal and round.   Lungs: clear to auscultation bilaterally.   Cardiovascular:    Heart regular rate and rhythm  Extremities:  extremities normal, atraumatic, no cyanosis or edema  Skin: scattered erythematous and papular rash on bilateral forearms with some crusting present.    Assessment/Plan  Assessment/Plan   1. Poison ivy dermatitis        Orders Placed This Encounter   . predniSONE (DELTASONE) 20 mg Oral Tablet     Prednisone per epic orders. Oral antihistamines, topical anti itch creams. Launder linens. Pt verbalized understanding and agrees with the plan of care. Call or return to clinic prn if these symptoms worsen or fail to improve as anticipated.    Judeen Hammans, PA-C  11/12/2021, 11:34    My collaborating physician today is Dr. Levonne Lapping.

## 2021-11-24 ENCOUNTER — Ambulatory Visit: Admit: 2021-11-24 | Discharge: 2021-11-24 | Payer: MEDICARE | Attending: Sports Medicine

## 2021-11-24 DIAGNOSIS — M5136 Other intervertebral disc degeneration, lumbar region: Secondary | ICD-10-CM

## 2021-11-24 MED ORDER — DICLOFENAC SODIUM 50 MG PO TBEC
50 MG | ORAL_TABLET | Freq: Two times a day (BID) | ORAL | 1 refills | Status: AC
Start: 2021-11-24 — End: ?

## 2021-11-24 NOTE — Progress Notes (Signed)
HISTORY OF PRESENT ILLNESS    Tiffany Boyd August 23, 1990 is a 31 y.o. year old female comes in today as new patient for: back, right hip pain    Patients symptoms have been present for 19-20 years back and hip pain since 2015.  Pain level 9/10 low back and side right hip. It has worsened with walk/stand a lot.  Patient has tried:  mobic, heat from PCP with benefit.  It is described as pain as above. Has been on muscle relaxers in high school and was told 3 bulging discs. Has also lost 60# and had breast reduction 2015 but pain still remains. Last PT 2015.    IMAGING: XR right hip 09/15/2021 images reviewed and agree with:  IMPRESSION:  Very minimal osteoarthritic changes without acute abnormality.    XR right hip 09/13/2021 images reviewed and agree with:  Normal study.    Past Surgical History:   Procedure Laterality Date    BREAST REDUCTION SURGERY      GYN      Csections X3     Social History     Socioeconomic History    Marital status: Single   Tobacco Use    Smoking status: Every Day     Packs/day: 0.50     Types: Cigarettes    Smokeless tobacco: Never   Substance and Sexual Activity    Alcohol use: Not Currently    Drug use: Yes     Types: Marijuana Sheran Fava)      Current Outpatient Medications   Medication Sig Dispense Refill    meloxicam (MOBIC) 15 MG tablet Take 1 tablet by mouth daily       No current facility-administered medications for this visit.     Past Medical History:   Diagnosis Date    Asthma     Depression      Family History   Problem Relation Age of Onset    Hypertension Mother      ROS:  No numb, fever. + stress incont urine    Objective:  Resp 14   Ht 5\' 1"  (1.549 m)   Wt 234 lb (106.1 kg)   BMI 44.21 kg/m   NEURO:  Sensation intact to light touch.  Reflexes +2/4 patellar and Achilles bilaterally.  M/S:  Examined seated and supine.  Slump negative. Spurling negative. Standing flexion test positive left  Sphinx test positive left.  ASIS low left  Iliac crests equal bilaterally Pubes equal bilaterally  Medial malleolus low left  Sacral base posterior left  ILA low left  TTA at C3 on left worse flexion, T2, 3 on right worse flexion, and L3, 5 on left worse flexion  Rib(s) 2 right TTP and posterior  LE Strength +5/5 bilaterally Piriformis tight and TTP right. Hip flexor tight and TTP right. Thoracic diaphragm restricted left. Scapula motion restricted w/ TTA right. Hip flexion limited left.    Assessment/Plan:    Diagnosis Orders   1. DDD (degenerative disc disease), lumbar  Ambulatory referral to Physical Therapy    diclofenac (VOLTAREN) 50 MG EC tablet      2. Piriformis syndrome, right  Ambulatory referral to Physical Therapy    diclofenac (VOLTAREN) 50 MG EC tablet      3. Hip flexor tendinitis, right  Ambulatory referral to Physical Therapy    diclofenac (VOLTAREN) 50 MG EC tablet      4. Lumbar region somatic dysfunction  PR OSTEOPATHIC MANIPULATIVE TX 9-10 BODY REGIONS      5. Pelvic  somatic dysfunction  PR OSTEOPATHIC MANIPULATIVE TX 9-10 BODY REGIONS      6. Sacral region somatic dysfunction  PR OSTEOPATHIC MANIPULATIVE TX 9-10 BODY REGIONS      7. Thoracic region somatic dysfunction  PR OSTEOPATHIC MANIPULATIVE TX 9-10 BODY REGIONS      8. Rib cage region somatic dysfunction  PR OSTEOPATHIC MANIPULATIVE TX 9-10 BODY REGIONS      9. Cervical somatic dysfunction  PR OSTEOPATHIC MANIPULATIVE TX 9-10 BODY REGIONS      10. Upper extremity somatic dysfunction  PR OSTEOPATHIC MANIPULATIVE TX 9-10 BODY REGIONS      11. Lower limb region somatic dysfunction  PR OSTEOPATHIC MANIPULATIVE TX 9-10 BODY REGIONS      12. Somatic dysfunction of abdominal region  PR OSTEOPATHIC MANIPULATIVE TX 9-10 BODY REGIONS          Patient (or guardian if minor) verbalizes understanding of evaluation and plan.    Verbal consent obtained.  Cervical, Thoracic, Rib, Lumbar, Pelvic, Sacral, Upper Ext, Lower Ext, and Abdominal SD treated with Myofascial and ME.  Correction of previous malalignments verified after Tx.    Pt tolerated well.   Notes improvement of Sx and pain is now rated 2/10.    HEP/stretches daily. Discussed stretching/strengthening/posture.    Will start HEP, PT, and Rx for voltaren 50mg   as above and plan follow-up 6 weeks.

## 2021-11-24 NOTE — Patient Instructions (Signed)
Search YouTube for my channel:    Dr. J. Leighanna Kirn    Low back/Piriformis    Hip Stretches

## 2021-11-24 NOTE — Progress Notes (Signed)
AVS reviewed: yes,   HEP: AT reviewed  Resources Provided: yes,  YouTube Videos  Patient questions/concerns answered: yes,   Patient verbalized understanding of treatment plan: yes,

## 2021-12-26 ENCOUNTER — Inpatient Hospital Stay: Payer: MEDICAID | Attending: Rehabilitative and Restorative Service Providers"

## 2022-01-07 ENCOUNTER — Encounter: Payer: MEDICARE | Attending: Rehabilitative and Restorative Service Providers"

## 2022-01-12 ENCOUNTER — Ambulatory Visit: Payer: MEDICARE | Attending: Sports Medicine

## 2022-01-28 ENCOUNTER — Inpatient Hospital Stay: Admit: 2022-01-28 | Payer: MEDICAID

## 2022-01-28 DIAGNOSIS — M5459 Other low back pain: Secondary | ICD-10-CM

## 2022-01-28 NOTE — Other (Signed)
[facility]  [address], [place] [address] Ph: [phone] Fx: [phone]  Plan of Care / Statement of Necessity for Physical Therapy Services     Patient Name: Tiffany Boyd DOB: January 01, 1991   Medical   Diagnosis: DDD (degenerative disc disease), lumbar [M51.36]  Piriformis syndrome, right [G57.01]  Hip flexor tendinitis, right [M76.891] Treatment Diagnosis:   M54.59, G89.29  CHRONIC LOWER BACK PAIN    Onset Date: 07/2011     Referral Source: Michaelle Birks Start of Care [facility]): 01/28/2022   Prior Hospitalization: See medical history Provider #: [phone]   Prior Level of Function: Pt was able to run, workout and play with her kids.    Comorbidities: Chronic pain in both the R hip and low back     Assessment / key information:  Pt presents with a 10+ year history of low back and hip pain. She presents with moderate strength deficits in her B LEs. Pt's pain is aggravated most with coughing, sneezing, prolonged sitting and repeated bending.     Evaluation Complexity:  History:  MEDIUM  Complexity : 1-2 comorbidities / personal factors will impact the outcome/ POC ; Examination:  LOW Complexity : 1-2 Standardized tests and measures addressing body structure, function, activity limitation and / or participation in recreation  ;Presentation:  LOW Complexity : Stable, uncomplicated  ;Clinical Decision Making:  MEDIUM Complexity : FOTO score of 26-74 FOTO score = an established functional score where 100 = no disability  Overall Complexity Rating: LOW   Problem List: pain affecting function, decrease ROM, decrease strength, impaired gait/balance, decrease ADL/functional abilities, decrease activity tolerance, decrease flexibility/joint mobility, and decrease transfer abilities    Treatment Plan may include any combination of the following: 37106 Therapeutic Exercise, 97112 Neuromuscular Re-Education, 97140 Manual Therapy, 97530 Therapeutic Activity, and 97535 Self  Care/Home Management  Patient / Family readiness to learn indicated by: trying to perform skills, return verbalization , and return demonstration   Persons(s) to be included in education: patient (P)  Barriers to Learning/Limitations: none  Measures taken if barriers to learning present:   Patient Goal (s): "To get some type of relief so I can do more."   Patient Self Reported Health Status: fair  Rehabilitation Potential: fair    Short Term Goals: To be accomplished in 2 weeks  Pt will be I with HEP to begin addressing functional limitations at home.  Evaluation:   Long Term Goals: To be accomplished in 4 weeks  1. Patient's pain level will be 3-4/10 with activity in order to improve patient's ability to perform normal ADLs.  Evaluation:  Pain is usually around 8-10/10 with activity.   2. Patient will be able to sleep with only waking due to pain 1-2x per night  Evaluation:  Wakes 3-4x a night  3. Patient will increase FOTO score to 60 to indicate increased functional mobility.  Evaluation:  52  4. Patient will be able to push her kids on the swing with 5 minutes with 3/10 or less pain   Evaluation:  Currently unable to push her kids on the swing.       Frequency / Duration: Patient to be seen 1-2 times per week for 6 weeks    Patient/ Caregiver education and instruction: Diagnosis, prognosis, self care, activity modification, and exercises [x]   Plan of care has been reviewed with PTA      , PT       01/28/2022  9:18 AM    Payor: Maude Leriche MEDICAID / Plan: [employer] MEDICAID / Product Type: *No Product type* /     Physician signature required for Medicare, Medicaid, Worker's Comp, Direct Access   ===================================================================  I certify that the above Therapy Services are being furnished while the patient is under my care. I agree with the treatment plan and certify that this therapy is necessary.    Physician's Signature:_________________________   DATE:_________    TIME:________                           Steffanie Dunn, DO    ** Signature, Date and Time must be completed for valid certification **  Please sign and fax to [facility]. Thank you

## 2022-01-28 NOTE — Other (Signed)
PT DAILY TREATMENT NOTE/LUMBAR EVAL 11-20    Patient Name: Tiffany Boyd    Date: 01/28/2022    DOB: 1990/07/21  Insurance: Payor: [employer] MEDICAID / Plan: [employer] MEDICAID / Product Type: *No Product type* /      Patient DOB verified yes     Visit #   Current / Total 1 12   Time   In / Out 1101 1139   Pain   In / Out 4 3   Subjective Functional Status/Changes: Pt has been having back issues since she was 12. She thought having a breast reduction would solve the issue, but she continued to have issues. Pain is aggravated with sneezing, coughing, epeated bending and sitting for prolonged times. She also has issues with her hip. Her back pain is concentrated to the thoracic and lumbar spine.  Her pain is eased with medication, sitting and heating pads. Pt wakes 3-4x a night due to pain.        Treatment Area: DDD (degenerative disc disease), lumbar [M51.36]  Piriformis syndrome, right [G57.01]  Hip flexor tendinitis, right [M76.891]  SUBJECTIVE  Pain Level (0-10 scale): 4  [] constant [] intermittent [] improving [] worsening [x] Pain fluctuates over the years    Any medication changes, allergies to medications, adverse drug reactions, diagnosis change, or new procedure performed?: [x]  No    []  Yes (see summary sheet for update)  Subjective functional status/changes:     PLOF: running, playing with her kids, pushing her kids on the swing  Mechanism of Injury: N/A - Insidious   Current symptoms/Complaints: Sharp, aching pain with occasional radicular symptoms down to the R foot.   Previous Treatment/Compliance: Had PT in 2016 which helped for a month before the pain returned.   PMHx/Surgical Hx: C section in 2017   Work Hx: - walking most of the day  Living Situation: Townhouse 1 flight of stairs  Pt Goals: "To get some type of relief so I can do more."   Barriers: [] pain [] financial [] time [] transportation [] other    OBJECTIVE/EXAMINATION  Domestic Life:  Pain completing chores such as washing dishes.    Activity/Recreational Limitations: Difficulty playing with her kids and running  Mobility: Affects her ability to negotiate stairs  Self Care: Some pain getting dressed and showering.            20 min [] Eval        - untimed        Therapeutic Procedures:  Tx Min Billable or 1:1 Min (if diff from Tx Min) Procedure, Rationale, Specifics   Total Total Reminder: MC/BC bill using total billable min of TIMED therapeutic procedures (example: do not include dry needle or estim unattended, both untimed codes, in totals to left)     [x]   Patient Education billed concurrently with other procedures       Physical Therapy Evaluation - Lumbar Spine ([facility])    SUBJECTIVE  Chief Complaint:    Mechanism of injury:    Symptoms:  Aggravated by:   [x]  Bending []  Sitting []  Standing []  Walking   []  Moving [x]  Cough []  Sneeze []  Valsalva   []  AM  [x]  PM  Lying:  []  sup   []  pro   []  sidelying   []  Other:     Eased by:    []  Bending [x]  Sitting []  Standing []  Walking   []  Moving []  AM  []  PM  Lying: []  sup  []  pro  []  sidelying   []  Other:  General Health:  Red Flags Indicated? []  Yes    []  No  []  Yes []  No Recent weight change (If yes, due to dieting? []  Yes  []  No)   [x]  Yes []  No Weakness in legs during walking  []  Yes []  No Unremitting pain at night  []  Yes []  No Abdominal pain or problems  []  Yes []  No Rectal bleeding  []  Yes []  No Feet more cold or painful in cold weather  []  Yes [x]  No Menstrual irregularities  []  Yes []  No Blood or pain with urination  []  Yes [x]  No Dysfunction of bowel or bladder  []  Yes []  No Recent illness within past 3 weeks (i.e, cold, flu)  []  Yes []  No Numbness/tingling in buttock/genitalia region    Past History/Treatments:     Diagnostic Tests: []  Lab work []  X-rays    []  CT [x]  MRI     []  Other:  Results: 3 bulging discs as of 10 years ago.     OBJECTIVE  Posture:  Lordosis:  [x]  Increased []  Decreased   []  WNL    Gait:  [x]  Normal     []  Abnormal:    Active Movements: []  N/A   []  Too acute    []  Other:  ROM % AROM % PROM Comments:pain, area   Forward flexion 40-60 80     Extension 20-30 20  Pain   SB right 20-30 30     SB left 20-30 30     Rotation right 5-10 15     Rotation left 5-10 15         Dural Mobility:  SLR Sitting: []  R    []  L    []  +    []  -  @ (degrees):           Supine: []  R    []  L    []  +    []  -  @ (degrees):   Slump Test: [x]  R    [x]  L    []  +    [x]  -  @ (degrees):   Prone Knee Bend: []  R    []  L    []  +    []  -     Palpation  []  Min  [x]  Mod  []  Severe    Location: L3-S1  []  Min  []  Mod  [x]  Severe    Location: B piriformis   [x]  Min  []  Mod  []  Severe    Location: T7-9    Strength   L(0-5) R (0-5) N/T   Hip Flexion (L1,2) 4+ 4+ []    Knee Extension (L3,4) 5- 4+ (P)  []    Hip Abduction 4 4 []    Hip Extension 4 4 []    Hip IR 4+ 4+ []    Knee Flexion (S1,2) 5- 5-  []    Hip ER 4+ 4+ []      Special Tests  Lumbar:  Lumb. Compression: [x]  Pos  []  Neg                    Hip: :  [x]  R    [x]  L    [x]  +    []  -     Scour:  []  R    []  L    []  +    []  -     Piriformis: [x]  R    [x]  L    [x]  +    []  -  Other tests/comments: Sensation intact B.        Pain Level (0-10 scale) post treatment: 3/10    ASSESSMENT/Changes in Function: Pt presents with moderate strength deficits through the B LEs. No radicular symptoms were present during the evaluation. Pt has the most pain with prolonged sitting, and repeated bending, indicating an extension bias. Pt reports moderately disturbed sleep, waking 3-4x each night due to pain, but she is usually able to go back to sleep after changing positions.     Patient will continue to benefit from skilled PT services to modify and progress therapeutic interventions, analyze and address functional mobility deficits, analyze and address ROM deficits, analyze and address strength deficits, analyze and address soft tissue restrictions, analyze and cue for proper movement patterns, analyze and modify for postural abnormalities, and instruct in home and  community integration to address functional deficits and attain remaining goals.    [x]   See Plan of Care - for goals and assessment     PLAN  [x]   Upgrade activities as tolerated     [x]   Continue plan of care  []   Update interventions per flow sheet       []   Other:_      , PT 01/28/2022  9:16 AM

## 2022-02-04 NOTE — Telephone Encounter (Signed)
NS 02/04/2022. LVM reminding patient of next appt.

## 2022-02-05 ENCOUNTER — Inpatient Hospital Stay: Admit: 2022-02-05 | Payer: MEDICAID

## 2022-02-05 NOTE — Progress Notes (Cosign Needed)
[facility]  [address], [place] [address] Ph: [phone] Fx: [phone]  DISCHARGE SUMMARY  Patient Name: Winola Bechard DOB: 01-29-1991   Treatment/Medical Diagnosis: DDD (degenerative disc disease), lumbar [M51.36]  Piriformis syndrome, right [G57.01]  Hip flexor tendinitis, right [Z30.865]   Referral Source: Steffanie Dunn, DO     Date of Initial Visit: 01/28/22 Attended Visits: 2 Missed Visits: 6     SUMMARY OF TREATMENT    Pt attended only initial evaluation and     1     follow-ups and then did not return.  Therefore a formal reassessment of goals was not performed.      RECOMMENDATIONS  Discontinue therapy due to lack of attendance or compliance.    If you have any questions/comments please contact us directly at [phone].   Thank you for allowing [place] to assist in the care of your patient.    Philopateer Strine C Kabria Hetzer, PTA       03/05/2022       1:43 PM    NOTE TO PHYSICIAN:  Your patient's insurance requires this discharge note be signed and returned.    ___ I have read the above report and request that my patient be discharged from therapy.     Physician's Signature:_________________________   DATE:_________   TIME:________                           Steffanie Dunn, DO    ** Signature, Date and Time must be completed for valid certification **  Please sign and fax to [facility] [phone]  Thank you

## 2022-02-05 NOTE — Progress Notes (Signed)
PHYSICAL / OCCUPATIONAL THERAPY - DAILY TREATMENT NOTE (updated 1/23)    Patient Name: Tiffany Boyd    Date: 02/05/2022    DOB: 01/23/1991  Insurance: Payor: [employer]A MEDICAID / Plan: [employer]A MEDICAID / Product Type: *No Product type* /      Patient DOB verified Yes     Visit #   Current / Total 2 12   Time   In / Out 420 504   Pain   In / Out 4 0   Subjective Functional Status/Changes: Patient states that she has noticed that the home exercises are helping manage her pain.      TREATMENT AREA =  DDD (degenerative disc disease), lumbar [M51.36]  Piriformis syndrome, right [G57.01]  Hip flexor tendinitis, right [M76.891]    OBJECTIVE    Modalities Rationale:     decrease pain to improve patient's ability to progress to PLOF and address remaining functional goals.    10 min  unbill []   Ice     [x]   Heat    location/position: Sitting; back    Skin assessment post-treatment (if applicable):    []   intact    []   redness- no adverse reaction                 [] redness - adverse reaction:         Therapeutic Procedures:    Tx Min Billable or 1:1 Min (if diff from Tx Min) Procedure, Rationale, Specifics   16  97110 Therapeutic Exercise (timed):  increase ROM, strength, coordination, balance, and proprioception to improve patient's ability to progress to PLOF and address remaining functional goals. (see flow sheet as applicable)     Details if applicable:       10  97112 Neuromuscular Re-Education (timed):  improve balance, coordination, kinesthetic sense, posture, core stability and proprioception to improve patient's ability to develop conscious control of individual muscles and awareness of position of extremities in order to progress to PLOF and address remaining functional goals. (see flow sheet as applicable)     Details if applicable:     8  97530 Therapeutic Activity (timed):  use of dynamic activities replicating functional movements to increase ROM, strength, coordination, balance, and proprioception in order to improve  patient's ability to progress to PLOF and address remaining functional goals.  (see flow sheet as applicable)     Details if applicable:     34  MC BC Totals Reminder: bill using total billable min of TIMED therapeutic procedures (example: do not include dry needle or estim unattended, both untimed codes, in totals to left)  8-22 min = 1 unit; 23-37 min = 2 units; 38-52 min = 3 units; 53-67 min = 4 units; 68-82 min = 5 units   Total Total     [x]   Patient Education billed concurrently with other procedures   [x]  Review HEP    []  Progressed/Changed HEP, detail:    []  Other detail:       Objective Information/Functional Measures/Assessment    Initiated exercises per POC. Patient had no increase in pain throughout session.     Patient will continue to benefit from skilled PT / OT services to modify and progress therapeutic interventions, analyze and address functional mobility deficits, analyze and address ROM deficits, analyze and address strength deficits, analyze and address soft tissue restrictions, analyze and cue for proper movement patterns, analyze and modify for postural abnormalities, analyze and address imbalance/dizziness, and instruct in home and community integration to address  functional deficits and attain remaining goals.    Progress toward goals / Updated goals:  []   See Progress Note/Recertification    Short Term Goals: To be accomplished in 2 weeks  Pt will be I with HEP to begin addressing functional limitations at home.  Evaluation: provided patient with HEP.   Current: patient reports performing HEP. 02/05/22  Long Term Goals: To be accomplished in 4 weeks  1. Patient's pain level will be 3-4/10 with activity in order to improve patient's ability to perform normal ADLs.  Evaluation:  Pain is usually around 8-10/10 with activity.   2. Patient will be able to sleep with only waking due to pain 1-2x per night  Evaluation:  Wakes 3-4x a night  3. Patient will increase FOTO score to 60 to indicate  increased functional mobility.  Evaluation:  52  4. Patient will be able to push her kids on the swing with 5 minutes with 3/10 or less pain     Evaluation:  Currently unable to push her kids on the swing.        Next PN/ RC due 02/28/22  Auth due NA    PLAN  Yes  Continue plan of care  []   Upgrade activities as tolerated  []   Discharge due to :  []   Other:    Karrah Mangini C Aldyn Toon, PTA    02/05/2022    4:24 PM    No future appointments.

## 2022-02-13 ENCOUNTER — Encounter: Payer: MEDICAID | Attending: Rehabilitative and Restorative Service Providers"

## 2022-02-13 NOTE — Telephone Encounter (Signed)
CXL <24hrs. Patient confused her appt time, will call if someone cancels later today

## 2022-02-18 NOTE — Telephone Encounter (Signed)
Patient cancelled >24 due to a family member passing. Patient confirmed that they would call back to reschedule.

## 2022-02-25 ENCOUNTER — Inpatient Hospital Stay: Payer: MEDICAID

## 2022-02-25 NOTE — Telephone Encounter (Signed)
CXL< 24hrs. Patient is out of town due to a death in the family. Requested to cancel today's appt.

## 2022-03-04 ENCOUNTER — Inpatient Hospital Stay: Payer: MEDICAID

## 2022-03-04 NOTE — Telephone Encounter (Signed)
R/S. Patient has a job interview at [employer], unable to make today's appt. Requested earlier but the schedule was full. Patient reschedued to 8/25

## 2022-03-05 NOTE — Telephone Encounter (Signed)
Patient unable to make today's appt. All future appts cancelled due to no show/cancellation policy. Patient has been discharged.

## 2022-08-30 ENCOUNTER — Encounter (INDEPENDENT_AMBULATORY_CARE_PROVIDER_SITE_OTHER): Payer: Self-pay

## 2022-10-12 ENCOUNTER — Other Ambulatory Visit: Payer: Self-pay

## 2022-10-12 ENCOUNTER — Ambulatory Visit (INDEPENDENT_AMBULATORY_CARE_PROVIDER_SITE_OTHER): Payer: BC Managed Care – PPO | Admitting: Physician Assistant

## 2022-10-12 ENCOUNTER — Encounter (INDEPENDENT_AMBULATORY_CARE_PROVIDER_SITE_OTHER): Payer: Self-pay | Admitting: Physician Assistant

## 2022-10-12 VITALS — BP 112/70 | HR 104 | Temp 98.6°F | Resp 16 | Wt 194.0 lb

## 2022-10-12 DIAGNOSIS — F17290 Nicotine dependence, other tobacco product, uncomplicated: Secondary | ICD-10-CM

## 2022-10-12 DIAGNOSIS — R52 Pain, unspecified: Secondary | ICD-10-CM

## 2022-10-12 DIAGNOSIS — R29898 Other symptoms and signs involving the musculoskeletal system: Secondary | ICD-10-CM

## 2022-10-12 NOTE — Progress Notes (Signed)
[name] [name]  Date of Service: 10/12/2022    Chief complaint:   Chief Complaint   Patient presents with    Generalized Body Aches     Joint pain  x 1 week     Subjective  32 y.o. female comes to Urgent Care clinic today complaining of body aches and joint pain x 1 week.   Pt states that she was sick with viral illness about 1 month ago and symptoms lingered for several weeks. No known family history-pt is adopted.  No personal history of autoimmune disorders.    ROS: no fever or chills, denies headaches, dizziness, chest pain or shortness of breath, denies black or bloody stools, abdominal pain, weakness or difficulty with ambulation, denies skin rash or lesion    Social History     Tobacco Use    Smoking status: Former     Current packs/day: 0.25     Average packs/day: 0.3 packs/day for 3.0 years (0.8 ttl pk-yrs)     Types: Cigarettes    Smokeless tobacco: Never    Tobacco comments:     quit when she found out she was pregnant   Vaping Use    Vaping status: Every Day   Substance Use Topics    Alcohol use: No     Alcohol/week: 0.0 standard drinks of alcohol    Drug use: No     Patient Active Problem List   Diagnosis    ASCUS on Pap smear for follow-up postpartum    Chlamydia infection, current pregnancy, treated    Hx MRSA infection    Tobacco use    Obesity    Fatigue     Family Medical History:    None         BIOTIN ORAL, Take by mouth  multivit with calcium,iron,min [facility] MULTIPLE VITAMINS ORAL), Take by mouth  buPROPion (WELLBUTRIN SR) 150 mg Oral Tablet Sustained Release 12 hr, Take 1 Tab (150 mg total) by mouth Twice daily (Patient not taking: Reported on 11/12/2021)  predniSONE (DELTASONE) 20 mg Oral Tablet, 2 tab x 3 days, 1 tab x 3 days, 1/2 tab x 4 days.    No facility-administered medications prior to visit.      Objective  Vitals: BP 112/70   Pulse (!) 104   Temp 37 C (98.6 F) (Tympanic)   Resp 16   Wt 88 kg (194 lb)   SpO2 98%   BMI 31.08 kg/m       General: no distress  Eyes: Conjunctiva  clear., Pupils equal and round.   Lungs: clear to auscultation bilaterally.   Cardiovascular:    Heart regular rate and rhythm  Extremities: extremities normal, atraumatic, no cyanosis or edema  Skin: Skin warm and dry, No rashes, and No lesions  Neurologic: grossly normal, gait is normal, alert and oriented X 3, normal strength and tone, normal symmetric reflexes, normal coordination and gait, and no tremor  MSK: normal strength and tone, no weakness, no joint effusions or erythema noted.    Assessment/Plan  Assessment/Plan   1. Body aches    2. Polyarticular joint involvement        Orders Placed This Encounter    CBC W/AUTO DIFF    COMPREHENSIVE METABOLIC PANEL, NON-FASTING    LYME ANTIBODY PANEL WITH REFLEX    C-REACTIVE PROTEIN(CRP),INFLAMMATION    Refer to [employer] ordered, advised patient this is not an exhaustive work up. Patient was referred to rheumatology  for further evaluation of this problem.  Pt verbalized understanding and agrees with the plan of care. Call or return to clinic prn if these symptoms worsen or fail to improve as anticipated.    Mariella Saa, PA-C    My collaborating physician today is Dr. Marland Mcalpine.

## 2022-10-12 NOTE — Nursing Note (Signed)
Blood pressure 112/70, pulse (!) 104, temperature 37 C (98.6 F), temperature source Tympanic, resp. rate 16, weight 88 kg (194 lb), SpO2 98%.  Gretchen Short, CMA

## 2022-10-14 ENCOUNTER — Other Ambulatory Visit: Payer: BC Managed Care – PPO | Attending: Physician Assistant

## 2022-10-14 ENCOUNTER — Other Ambulatory Visit: Payer: Self-pay

## 2022-10-14 DIAGNOSIS — R52 Pain, unspecified: Secondary | ICD-10-CM

## 2022-10-14 LAB — COMPREHENSIVE METABOLIC PANEL, NON-FASTING
ALBUMIN: 3.7 g/dL (ref 3.5–5.0)
ALKALINE PHOSPHATASE: 60 U/L (ref 40–110)
ALT (SGPT): 11 U/L (ref 8–22)
ANION GAP: 8 mmol/L (ref 4–13)
AST (SGOT): 12 U/L (ref 8–45)
BILIRUBIN TOTAL: 0.8 mg/dL (ref 0.3–1.3)
BUN/CREA RATIO: 7 (ref 6–22)
BUN: 5 mg/dL — ABNORMAL LOW (ref 8–25)
CALCIUM: 8.8 mg/dL (ref 8.6–10.2)
CHLORIDE: 105 mmol/L (ref 96–111)
CO2 TOTAL: 24 mmol/L (ref 22–30)
CREATININE: 0.75 mg/dL (ref 0.60–1.05)
ESTIMATED GFR - FEMALE: >90 mL/min/BSA (ref >=60)
GLUCOSE: 96 mg/dL (ref 65–125)
POTASSIUM: 3.3 mmol/L — ABNORMAL LOW (ref 3.5–5.1)
PROTEIN TOTAL: 7.1 g/dL (ref 6.4–8.3)
SODIUM: 137 mmol/L (ref 136–145)

## 2022-10-14 LAB — C-REACTIVE PROTEIN(CRP),INFLAMMATION: CRP INFLAMMATION: 44.5 mg/L — ABNORMAL HIGH (ref <8.0)

## 2022-10-14 LAB — CBC W/AUTO DIFF
BASOPHIL #: 0.14 10*3/uL (ref <=0.20)
BASOPHIL %: 1 %
EOSINOPHIL #: 0.19 10*3/uL (ref <=0.50)
EOSINOPHIL %: 1 %
HCT: 40.9 % (ref 34.8–46.0)
HGB: 13.8 g/dL (ref 11.5–16.0)
IMMATURE GRANULOCYTE #: <0.1 10*3/uL (ref <0.10)
IMMATURE GRANULOCYTE %: 1 % (ref 0.0–1.0)
LYMPHOCYTE #: 2.23 10*3/uL (ref 1.00–4.80)
LYMPHOCYTE %: 15 %
MCH: 28.5 pg (ref 26.0–32.0)
MCHC: 33.7 g/dL (ref 31.0–35.5)
MCV: 84.3 fL (ref 78.0–100.0)
MONOCYTE #: 1.06 10*3/uL (ref 0.20–1.10)
MONOCYTE %: 7 %
MPV: 9.8 fL (ref 8.7–12.5)
NEUTROPHIL #: 11.06 10*3/uL — ABNORMAL HIGH (ref 1.50–7.70)
NEUTROPHIL %: 75 %
PLATELETS: 326 10*3/uL (ref 150–400)
RBC: 4.85 10*6/uL (ref 3.85–5.22)
RDW-CV: 12.6 % (ref 11.5–15.5)
WBC: 14.8 10*3/uL — ABNORMAL HIGH (ref 3.7–11.0)

## 2022-10-15 NOTE — Result Encounter Note (Signed)
[name] [name]  As per my conversation with you today, your potassium level is mildly low.  Sodium, kidney function, liver function , glucose are normal.  I advise you to increase diet rich in potassium like been and has not citrus fruits and recheck the level in a week with primary care.  Follow-up with rheumatology as recommended.

## 2022-10-16 LAB — LYME ANTIBODY PANEL WITH REFLEX: LYME ANTIBODY TOTAL (Screen): NEGATIVE

## 2022-11-09 ENCOUNTER — Other Ambulatory Visit: Payer: Self-pay

## 2022-11-09 ENCOUNTER — Ambulatory Visit (INDEPENDENT_AMBULATORY_CARE_PROVIDER_SITE_OTHER): Payer: BC Managed Care – PPO

## 2022-11-26 ENCOUNTER — Encounter (INDEPENDENT_AMBULATORY_CARE_PROVIDER_SITE_OTHER): Payer: Self-pay | Admitting: RHEUMATOLOGY

## 2022-11-26 ENCOUNTER — Ambulatory Visit (INDEPENDENT_AMBULATORY_CARE_PROVIDER_SITE_OTHER): Payer: BC Managed Care – PPO | Admitting: RHEUMATOLOGY

## 2022-11-26 ENCOUNTER — Other Ambulatory Visit: Payer: Self-pay

## 2022-11-26 VITALS — BP 145/93 | HR 87 | Temp 97.1°F | Resp 20 | Ht 66.0 in | Wt 186.0 lb

## 2022-11-26 DIAGNOSIS — M064 Inflammatory polyarthropathy: Secondary | ICD-10-CM

## 2022-11-26 DIAGNOSIS — M1990 Unspecified osteoarthritis, unspecified site: Secondary | ICD-10-CM

## 2022-11-26 DIAGNOSIS — R531 Weakness: Secondary | ICD-10-CM

## 2022-11-26 DIAGNOSIS — Z79899 Other long term (current) drug therapy: Secondary | ICD-10-CM

## 2022-11-26 DIAGNOSIS — E559 Vitamin D deficiency, unspecified: Secondary | ICD-10-CM

## 2022-11-26 DIAGNOSIS — R52 Pain, unspecified: Secondary | ICD-10-CM

## 2022-11-26 MED ORDER — METHYLPREDNISOLONE 4 MG TABLETS IN A DOSE PACK
ORAL_TABLET | ORAL | 0 refills | Status: DC
Start: 2022-11-26 — End: 2022-12-08
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
## 2022-11-26 NOTE — Nursing Note (Signed)
BP (!) 145/93   Pulse 87   Temp 36.2 C (97.1 F) (Temporal)   Resp 20   Ht 1.676 m (5\' 6" )   Wt 84.4 kg (186 lb)   SpO2 98%   BMI 30.02 kg/m   Bryon Lions, MA

## 2022-11-27 ENCOUNTER — Other Ambulatory Visit: Payer: BC Managed Care – PPO | Attending: RHEUMATOLOGY

## 2022-11-27 DIAGNOSIS — R531 Weakness: Secondary | ICD-10-CM | POA: Insufficient documentation

## 2022-11-27 DIAGNOSIS — Z79899 Other long term (current) drug therapy: Secondary | ICD-10-CM

## 2022-11-27 DIAGNOSIS — E559 Vitamin D deficiency, unspecified: Secondary | ICD-10-CM | POA: Insufficient documentation

## 2022-11-27 DIAGNOSIS — M1990 Unspecified osteoarthritis, unspecified site: Secondary | ICD-10-CM | POA: Insufficient documentation

## 2022-11-27 DIAGNOSIS — M064 Inflammatory polyarthropathy: Secondary | ICD-10-CM | POA: Insufficient documentation

## 2022-11-27 LAB — HCG, PLASMA OR SERUM QUANTITATIVE, PREGNANCY: HCG QUANTITATIVE PREGNANCY: <2 m[IU]/mL (ref <5)

## 2022-11-27 LAB — SEDIMENTATION RATE: ERYTHROCYTE SEDIMENTATION RATE (ESR): 18 mm/hr (ref 0–20)

## 2022-11-27 LAB — C-REACTIVE PROTEIN(CRP),INFLAMMATION: CRP INFLAMMATION: 7.1 mg/L (ref <8.0)

## 2022-11-27 LAB — VITAMIN D 25 TOTAL: VITAMIN D 25, TOTAL: 11.1 ng/mL — ABNORMAL LOW (ref 30.0–100.0)

## 2022-11-27 LAB — CREATINE KINASE (CK), TOTAL, SERUM OR PLASMA: CREATINE KINASE: 42 U/L (ref 25–190)

## 2022-11-28 LAB — HEPATITIS B SURFACE ANTIGEN: HBV SURFACE ANTIGEN QUALITATIVE: NEGATIVE

## 2022-11-28 LAB — HEPATITIS B CORE IGM, AB: HBV CORE IGM ANTIBODY QUALITATIVE: NEGATIVE

## 2022-11-28 LAB — HEPATITIS A (HAV) IGM ANTIBODY: HAV IGM: NEGATIVE

## 2022-11-28 LAB — HEPATITIS C ANTIBODY SCREEN WITH REFLEX TO HCV PCR: HCV ANTIBODY QUALITATIVE: NEGATIVE

## 2022-11-28 LAB — RHEUMATOID FACTOR, SERUM: RHEUMATOID FACTOR: 63 IU/mL — ABNORMAL HIGH (ref <=30)

## 2022-11-30 LAB — CHLAMYDIA TRACHOMATIS/NEISSERIA GONORRHOEAE RNA, NAAT
CHLAMYDIA TRACHOMATIS RNA: NEGATIVE
NEISSERIA GONORRHEA GC RNA: NEGATIVE

## 2022-12-01 LAB — CYCLIC CITRULLINATED PEPTIDE ANTIBODIES, IGG, SERUM
CYCLIC CITRULLINATED PEPTIDE ANTIBODY IGG QUAL: POSITIVE — AB
CYCLIC CITRULLINATED PEPTIDE ANTIBODY IGG QUANT: 26483.1 U/mL — ABNORMAL HIGH (ref <3.0)

## 2022-12-01 LAB — ANA REFLEX
ANA FINAL INTERPRETATION: POSITIVE — AB
ANA TITER: 1:320 {titer}

## 2022-12-02 LAB — ALDOLASE, SERUM: ALDOLASE: 4.7 U/L (ref <=8.1)

## 2022-12-08 ENCOUNTER — Ambulatory Visit (HOSPITAL_COMMUNITY): Payer: BC Managed Care – PPO

## 2022-12-08 ENCOUNTER — Telehealth (HOSPITAL_COMMUNITY): Payer: Self-pay | Admitting: RHEUMATOLOGY

## 2022-12-08 ENCOUNTER — Other Ambulatory Visit (INDEPENDENT_AMBULATORY_CARE_PROVIDER_SITE_OTHER): Payer: Self-pay | Admitting: RHEUMATOLOGY

## 2022-12-08 ENCOUNTER — Other Ambulatory Visit: Payer: Self-pay

## 2022-12-08 ENCOUNTER — Inpatient Hospital Stay
Admission: RE | Admit: 2022-12-08 | Discharge: 2022-12-08 | Disposition: A | Discharge status: Home / Self Care | Payer: BC Managed Care – PPO | Source: Ambulatory Visit | Attending: RHEUMATOLOGY | Admitting: RHEUMATOLOGY

## 2022-12-08 ENCOUNTER — Encounter (HOSPITAL_COMMUNITY): Payer: Self-pay

## 2022-12-08 DIAGNOSIS — M25512 Pain in left shoulder: Secondary | ICD-10-CM

## 2022-12-08 DIAGNOSIS — M1990 Unspecified osteoarthritis, unspecified site: Secondary | ICD-10-CM

## 2022-12-08 DIAGNOSIS — Z79899 Other long term (current) drug therapy: Secondary | ICD-10-CM | POA: Insufficient documentation

## 2022-12-08 DIAGNOSIS — M25511 Pain in right shoulder: Secondary | ICD-10-CM

## 2022-12-08 DIAGNOSIS — M7989 Other specified soft tissue disorders: Secondary | ICD-10-CM

## 2022-12-08 DIAGNOSIS — R768 Other specified abnormal immunological findings in serum: Secondary | ICD-10-CM

## 2022-12-08 MED ORDER — PREDNISONE 10 MG TABLET
10.0000 mg | ORAL_TABLET | Freq: Every day | ORAL | 0 refills | Status: DC
Start: 2022-12-08 — End: 2022-12-17

## 2022-12-08 NOTE — Telephone Encounter (Signed)
Please let her know that based on her intial work up I need more blood tests which I will place the orders. Also advise to complete the x rays I ordered . Meanwhile I will send prednisone 10 mg to her pharmacy ,she is supposed to stay on it till her follow up with me

## 2022-12-08 NOTE — Telephone Encounter (Signed)
Patient states pain in her hands has returned. Hands and feet are swollen and she has significant pain. She felt fine while on the steroids, but she has completed that medication.    Holley Dexter, RN  [facility]  12/08/2022, 09:56

## 2022-12-08 NOTE — Telephone Encounter (Signed)
Sent Dr. Cleopatra Cedar reply to patient via My Chart message    Holley Dexter, RN  [facility]  12/08/2022, 12:16

## 2022-12-09 ENCOUNTER — Other Ambulatory Visit: Payer: BC Managed Care – PPO | Attending: RHEUMATOLOGY

## 2022-12-09 DIAGNOSIS — M1990 Unspecified osteoarthritis, unspecified site: Secondary | ICD-10-CM | POA: Insufficient documentation

## 2022-12-09 DIAGNOSIS — R768 Other specified abnormal immunological findings in serum: Secondary | ICD-10-CM

## 2022-12-09 LAB — PROTEIN/CREATININE RATIO, URINE, RANDOM
CREATININE RANDOM URINE: 94 mg/dL
PROTEIN RANDOM URINE: <7 mg/dL

## 2022-12-11 LAB — RNP ANTIBODIES, IGG, SERUM
RNP ANTIBODIES, IGG, QUALITATIVE: NEGATIVE
RNP ANTIBODIES, IGG, QUANTITATIVE: <0.2 [AU]/ml (ref <1.0)

## 2022-12-11 LAB — SM (SMITH) ANTIBODIES, IGG, SERUM: SM ANTIBODIES, IGG, QUALITATIVE: NEGATIVE

## 2022-12-11 LAB — C4 COMPLEMENT, SERUM: C4 COMPLEMENT: 19 mg/dL (ref 12–39)

## 2022-12-11 LAB — C3 COMPLEMENT, SERUM: C3 COMPLEMENT: 129 mg/dL (ref 81–157)

## 2022-12-11 LAB — ANTI-DOUBLE STRANDED DNA AB: ANTI DNA ANTIBODY: NEGATIVE

## 2022-12-17 ENCOUNTER — Other Ambulatory Visit: Payer: Self-pay

## 2022-12-17 ENCOUNTER — Encounter (INDEPENDENT_AMBULATORY_CARE_PROVIDER_SITE_OTHER): Payer: Self-pay | Admitting: RHEUMATOLOGY

## 2022-12-17 ENCOUNTER — Ambulatory Visit (INDEPENDENT_AMBULATORY_CARE_PROVIDER_SITE_OTHER): Payer: BC Managed Care – PPO | Admitting: RHEUMATOLOGY

## 2022-12-17 VITALS — BP 142/105 | HR 95 | Temp 97.1°F | Resp 18 | Ht 66.0 in | Wt 187.6 lb

## 2022-12-17 DIAGNOSIS — M1990 Unspecified osteoarthritis, unspecified site: Secondary | ICD-10-CM

## 2022-12-17 DIAGNOSIS — M069 Rheumatoid arthritis, unspecified: Secondary | ICD-10-CM

## 2022-12-17 DIAGNOSIS — R768 Other specified abnormal immunological findings in serum: Secondary | ICD-10-CM

## 2022-12-17 DIAGNOSIS — Z79899 Other long term (current) drug therapy: Secondary | ICD-10-CM

## 2022-12-17 MED ORDER — PREDNISONE 10 MG TABLET
10.0000 mg | ORAL_TABLET | Freq: Every day | ORAL | 0 refills | Status: DC
Start: 2022-12-17 — End: 2022-12-28
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
## 2022-12-17 NOTE — Nursing Note (Signed)
BP (!) 144/90   Pulse 95   Temp 36.2 C (97.1 F) (Thermal Scan)   Resp 18   Ht 1.676 m (5\' 6" )   Wt 85.1 kg (187 lb 9.6 oz)   SpO2 98%   BMI 30.28 kg/m   Bryon Lions, MA

## 2022-12-23 ENCOUNTER — Encounter (INDEPENDENT_AMBULATORY_CARE_PROVIDER_SITE_OTHER): Payer: Self-pay | Admitting: RHEUMATOLOGY

## 2022-12-24 ENCOUNTER — Other Ambulatory Visit (INDEPENDENT_AMBULATORY_CARE_PROVIDER_SITE_OTHER): Payer: Self-pay | Admitting: RHEUMATOLOGY

## 2022-12-24 DIAGNOSIS — M1990 Unspecified osteoarthritis, unspecified site: Secondary | ICD-10-CM

## 2022-12-24 MED ORDER — METHOTREXATE SODIUM 2.5 MG TABLET
10.0000 mg | ORAL_TABLET | ORAL | 0 refills | Status: DC
Start: 2022-12-24 — End: 2023-01-28

## 2022-12-24 MED ORDER — FOLIC ACID 1 MG TABLET
1.0000 mg | ORAL_TABLET | Freq: Every day | ORAL | 0 refills | Status: AC
Start: 2022-12-24 — End: 2023-03-24

## 2022-12-28 ENCOUNTER — Other Ambulatory Visit (INDEPENDENT_AMBULATORY_CARE_PROVIDER_SITE_OTHER): Payer: Self-pay | Admitting: RHEUMATOLOGY

## 2022-12-28 DIAGNOSIS — R768 Other specified abnormal immunological findings in serum: Secondary | ICD-10-CM

## 2022-12-28 DIAGNOSIS — M1990 Unspecified osteoarthritis, unspecified site: Secondary | ICD-10-CM

## 2022-12-28 MED ORDER — PREDNISONE 10 MG TABLET
10.0000 mg | ORAL_TABLET | Freq: Every day | ORAL | 0 refills | Status: AC
Start: 2022-12-28 — End: 2023-03-28

## 2023-01-25 ENCOUNTER — Other Ambulatory Visit: Payer: BC Managed Care – PPO | Attending: RHEUMATOLOGY

## 2023-01-25 ENCOUNTER — Other Ambulatory Visit: Payer: Self-pay

## 2023-01-25 DIAGNOSIS — M069 Rheumatoid arthritis, unspecified: Secondary | ICD-10-CM | POA: Insufficient documentation

## 2023-01-25 DIAGNOSIS — Z79899 Other long term (current) drug therapy: Secondary | ICD-10-CM | POA: Insufficient documentation

## 2023-01-25 LAB — CBC WITH DIFF
BASOPHIL #: 0.1 10*3/uL (ref <=0.20)
BASOPHIL %: 1 %
EOSINOPHIL #: 0.26 10*3/uL (ref <=0.50)
EOSINOPHIL %: 2 %
HCT: 43.5 % (ref 34.8–46.0)
HGB: 14.4 g/dL (ref 11.5–16.0)
IMMATURE GRANULOCYTE #: <0.1 10*3/uL (ref <0.10)
IMMATURE GRANULOCYTE %: 0 % (ref 0.0–1.0)
LYMPHOCYTE #: 2.93 10*3/uL (ref 1.00–4.80)
LYMPHOCYTE %: 26 %
MCH: 29 pg (ref 26.0–32.0)
MCHC: 33.1 g/dL (ref 31.0–35.5)
MCV: 87.5 fL (ref 78.0–100.0)
MONOCYTE #: 0.76 10*3/uL (ref 0.20–1.10)
MONOCYTE %: 7 %
MPV: 9.2 fL (ref 8.7–12.5)
NEUTROPHIL #: 7.37 10*3/uL (ref 1.50–7.70)
NEUTROPHIL %: 64 %
PLATELETS: 326 10*3/uL (ref 150–400)
RBC: 4.97 10*6/uL (ref 3.85–5.22)
RDW-CV: 15.1 % (ref 11.5–15.5)
WBC: 11.5 10*3/uL — ABNORMAL HIGH (ref 3.7–11.0)

## 2023-01-25 LAB — HEPATIC FUNCTION PANEL
ALBUMIN: 3.8 g/dL (ref 3.5–5.0)
ALKALINE PHOSPHATASE: 47 U/L (ref 40–110)
ALT (SGPT): 11 U/L (ref 8–22)
AST (SGOT): 13 U/L (ref 8–45)
BILIRUBIN DIRECT: 0.2 mg/dL (ref 0.1–0.4)
BILIRUBIN TOTAL: 0.7 mg/dL (ref 0.3–1.3)
PROTEIN TOTAL: 6.9 g/dL (ref 6.4–8.3)

## 2023-01-25 LAB — CREATININE WITH EGFR
CREATININE: 0.89 mg/dL (ref 0.60–1.05)
ESTIMATED GFR - FEMALE: 88 mL/min/BSA (ref >=60)

## 2023-01-25 LAB — SEDIMENTATION RATE: ERYTHROCYTE SEDIMENTATION RATE (ESR): 4 mm/hr (ref 0–20)

## 2023-01-25 LAB — C-REACTIVE PROTEIN(CRP),INFLAMMATION: CRP INFLAMMATION: 2.3 mg/L (ref <8.0)

## 2023-01-27 LAB — QUANTIFERON TB1: QFT TB1: 0.08 IU/mL

## 2023-01-27 LAB — QUANTIFERON TB2: QFT TB2: 0.06 IU/mL

## 2023-01-27 LAB — QUANTIFERON
QFT MITOGEN: >10 IU/mL
QFT NIL: 0.0217 IU/mL
QFT QUALITATIVE: NEGATIVE
QFT TB1: 0.08 IU/mL
QFT TB2: 0.06 IU/mL

## 2023-01-27 LAB — QUANTIFERON TB MITOGEN: QFT MITOGEN: >10 IU/mL

## 2023-01-27 LAB — QUANTIFERON TB NIL: QFT NIL: 0.0217 IU/mL

## 2023-01-28 ENCOUNTER — Encounter (INDEPENDENT_AMBULATORY_CARE_PROVIDER_SITE_OTHER): Payer: Self-pay | Admitting: RHEUMATOLOGY

## 2023-01-28 ENCOUNTER — Ambulatory Visit: Payer: BC Managed Care – PPO | Attending: RHEUMATOLOGY | Admitting: RHEUMATOLOGY

## 2023-01-28 ENCOUNTER — Other Ambulatory Visit: Payer: Self-pay

## 2023-01-28 VITALS — BP 136/85 | HR 76 | Temp 97.6°F | Resp 18 | Ht 66.0 in | Wt 188.8 lb

## 2023-01-28 DIAGNOSIS — Z7952 Long term (current) use of systemic steroids: Secondary | ICD-10-CM | POA: Insufficient documentation

## 2023-01-28 DIAGNOSIS — Z79631 Long term (current) use of antimetabolite agent: Secondary | ICD-10-CM | POA: Insufficient documentation

## 2023-01-28 DIAGNOSIS — Z79899 Other long term (current) drug therapy: Secondary | ICD-10-CM | POA: Insufficient documentation

## 2023-01-28 DIAGNOSIS — M1990 Unspecified osteoarthritis, unspecified site: Secondary | ICD-10-CM | POA: Insufficient documentation

## 2023-01-28 MED ORDER — METHOTREXATE SODIUM 2.5 MG TABLET
15.0000 mg | ORAL_TABLET | ORAL | 0 refills | Status: DC
Start: 2023-01-28 — End: 2023-05-07

## 2023-01-28 NOTE — Nursing Note (Signed)
BP 136/85   Pulse 76   Temp 36.4 C (97.6 F) (Thermal Scan)   Resp 18   Ht 1.676 m (5\' 6" )   Wt 85.6 kg (188 lb 12.8 oz)   SpO2 97%   BMI 30.47 kg/m   Bryon Lions, MA
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
## 2023-02-23 ENCOUNTER — Ambulatory Visit: Payer: BC Managed Care – PPO | Attending: FAMILY MEDICINE | Admitting: FAMILY MEDICINE

## 2023-02-23 ENCOUNTER — Encounter (INDEPENDENT_AMBULATORY_CARE_PROVIDER_SITE_OTHER): Payer: Self-pay | Admitting: FAMILY MEDICINE

## 2023-02-23 ENCOUNTER — Other Ambulatory Visit: Payer: Self-pay

## 2023-02-23 VITALS — BP 128/90 | HR 83 | Temp 99.0°F | Resp 18 | Ht 66.0 in | Wt 193.2 lb

## 2023-02-23 DIAGNOSIS — Z309 Encounter for contraceptive management, unspecified: Secondary | ICD-10-CM | POA: Insufficient documentation

## 2023-02-23 DIAGNOSIS — M329 Systemic lupus erythematosus, unspecified: Secondary | ICD-10-CM | POA: Insufficient documentation

## 2023-02-23 DIAGNOSIS — M069 Rheumatoid arthritis, unspecified: Secondary | ICD-10-CM | POA: Insufficient documentation

## 2023-02-23 LAB — POCT URINE HCG: PREGNANCY, URINE: NEGATIVE

## 2023-02-23 MED ORDER — DROSPIRENONE 3 MG-ETHINYL ESTRADIOL 0.03 MG TABLET
1.0000 | ORAL_TABLET | Freq: Every day | ORAL | 3 refills | Status: DC
Start: 2023-02-23 — End: 2023-10-11

## 2023-02-23 NOTE — Progress Notes (Signed)
FAMILY MEDICINE - NEW PATIENT    Patient ID: [name] [name]   MRN: [id number]   Date: 02/23/2023     Assessment and Plan   [name] [name] is a 32 y.o.  who presents to the office to establish care.    Rheumatoid arthritis (CMS HCC)    She is currently following with rheumatology, taking folic acid and methotrexate 2.5 mg to manage her pain. Pain affects her hands and feet the most.     - Continue folic acid and methotrexate 2.5 mg  - Continue to monitor     Encounter for contraceptive management  She is currently interested in birth control. She would like to avoid weight gain.     - Discussed varius contraceptive options  - Rx Yasmin 3-0.3 mg   - Continue to monitor        Orders Placed This Encounter    POCT URINE HCG    Drospirenone-Ethinyl Estradiol (equiv to: YASMIN) 3-0.03 mg Oral Tablet        Health Maintenance  Health Maintenance Due   Topic Date Due    NonMedicare Preventative Exam  Never done    HIV Screening  Never done    Hepatitis B Vaccine (1 of 3 - 19+ 3-dose series) Never done    Pap smear/HPV  05/08/2012    Pneumococcal Vaccine, Age 33-64 (2 of 2 - PCV) 02/27/2019    Covid-19 Vaccine (3 - Pfizer risk series) 12/09/2019    Adult Tdap-Td (2 - Td or Tdap) 02/19/2022         Subjective       [name] [name] is a 32 y.o. patient who presents to the [facility] office to establish care. she also has a chief complaint ofEstablish Care and Contraception  .  Patient seen unaccompanied.     Patient is currently following with rheumatology for treatment of rheumatoid arthritis.     She is interested in discussing birth control.         Review of systems as above    Objective   VITALS: BP (!) 128/90   Pulse 83   Temp 37.2 C (99 F) (Oral)   Resp 18   Ht 1.676 m (5\' 6" )   Wt 87.6 kg (193 lb 3.2 oz)   SpO2 97%   BMI 31.18 kg/m       BMI: Body mass index is 31.18 kg/m.   Physical Exam  Constitutional:       Appearance: Normal appearance.   HENT:      Head: Normocephalic and atraumatic.      Mouth/Throat:       Mouth: Mucous membranes are moist.      Pharynx: Oropharynx is clear.   Eyes:      Extraocular Movements: Extraocular movements intact.      Pupils: Pupils are equal, round, and reactive to light.   Cardiovascular:      Rate and Rhythm: Normal rate and regular rhythm.      Pulses: Normal pulses.   Pulmonary:      Effort: Pulmonary effort is normal.      Breath sounds: Normal breath sounds.   Abdominal:      General: Bowel sounds are normal.      Palpations: Abdomen is soft.   Musculoskeletal:         General: Normal range of motion.      Cervical back: Neck supple.   Neurological:      General: No focal deficit present.  Mental Status: She is alert and oriented to person, place, and time.         Current Outpatient Medications   Medication Instructions    BIOTIN ORAL Take by mouth    chlorhexidine gluconate (PERIDEX) 0.12 % Mucous Membrane Mouthwash RINSE MOUTH WITH 15 ML (1 CAPFUL) FOR 30 SECONDS IN THE MORNING AND EVENING AFTER TOOTHBRUSHING. EXPECTORATE AFTER RINSING, DO NOT SWALLOW.    Drospirenone-Ethinyl Estradiol (equiv to: YASMIN) 3-0.03 mg Oral Tablet 1 Tablet, Oral, DAILY    folic acid (FOLVITE) 1 mg, Oral, DAILY    methotrexate 15 mg, Oral, EVERY 7 DAYS    multivit with calcium,iron,min [facility] MULTIPLE VITAMINS ORAL) Oral    predniSONE (DELTASONE) 10 mg, Oral, DAILY      I am scribing for, and in the presence of, Dr. Fara Boros for services provided on 02/23/2023.  Daryl Eastern, SCRIBE   Daryl Eastern, SCRIBE      ASSESSMENT AND PLAN AT TOP OF NOTE    I personally performed the services described in this documentation, as scribed  in my presence, and it is both accurate  and complete.    Lorenza Burton, MD

## 2023-02-23 NOTE — Nursing Note (Signed)
02/23/23 1100   HCG   Time Performed 1158   Urine HCG Negative   Rapid HCG Lot# [id number]   Expiration Date 04/26/24   Internal Control Valid yes   Initials smb

## 2023-02-23 NOTE — Assessment & Plan Note (Signed)
She is currently following with rheumatology, taking folic acid and methotrexate 2.5 mg to manage her pain. Pain affects her hands and feet the most.     - Continue folic acid and methotrexate 2.5 mg  - Continue to monitor

## 2023-02-23 NOTE — Assessment & Plan Note (Signed)
She is currently interested in birth control. She would like to avoid weight gain.     - Discussed varius contraceptive options  - Rx Yasmin 3-0.3 mg   - Continue to monitor
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
## 2023-03-02 ENCOUNTER — Encounter (INDEPENDENT_AMBULATORY_CARE_PROVIDER_SITE_OTHER): Payer: Self-pay | Admitting: FAMILY MEDICINE

## 2023-04-01 ENCOUNTER — Ambulatory Visit (INDEPENDENT_AMBULATORY_CARE_PROVIDER_SITE_OTHER): Payer: Self-pay | Admitting: RHEUMATOLOGY

## 2023-04-05 ENCOUNTER — Ambulatory Visit (INDEPENDENT_AMBULATORY_CARE_PROVIDER_SITE_OTHER): Payer: Self-pay | Admitting: RHEUMATOLOGY

## 2023-05-05 ENCOUNTER — Other Ambulatory Visit: Payer: Self-pay

## 2023-05-05 ENCOUNTER — Other Ambulatory Visit: Payer: BC Managed Care – PPO | Attending: RHEUMATOLOGY

## 2023-05-05 DIAGNOSIS — Z79899 Other long term (current) drug therapy: Secondary | ICD-10-CM | POA: Insufficient documentation

## 2023-05-05 DIAGNOSIS — M1990 Unspecified osteoarthritis, unspecified site: Secondary | ICD-10-CM | POA: Insufficient documentation

## 2023-05-05 LAB — CBC WITH DIFF
BASOPHIL #: 0.14 10*3/uL (ref <=0.20)
BASOPHIL %: 1.3 %
EOSINOPHIL #: 0.32 10*3/uL (ref <=0.50)
EOSINOPHIL %: 3 %
HCT: 40.8 % (ref 34.8–46.0)
HGB: 13.7 g/dL (ref 11.5–16.0)
IMMATURE GRANULOCYTE #: <0.1 10*3/uL (ref <0.10)
IMMATURE GRANULOCYTE %: 0.2 % (ref 0.0–1.0)
LYMPHOCYTE #: 2.56 10*3/uL (ref 1.00–4.80)
LYMPHOCYTE %: 24 %
MCH: 29.3 pg (ref 26.0–32.0)
MCHC: 33.6 g/dL (ref 31.0–35.5)
MCV: 87.4 fL (ref 78.0–100.0)
MONOCYTE #: 0.83 10*3/uL (ref 0.20–1.10)
MONOCYTE %: 7.8 %
MPV: 9.4 fL (ref 8.7–12.5)
NEUTROPHIL #: 6.79 10*3/uL (ref 1.50–7.70)
NEUTROPHIL %: 63.7 %
PLATELETS: 375 10*3/uL (ref 150–400)
RBC: 4.67 10*6/uL (ref 3.85–5.22)
RDW-CV: 13.2 % (ref 11.5–15.5)
WBC: 10.7 10*3/uL (ref 3.7–11.0)

## 2023-05-05 LAB — HEPATIC FUNCTION PANEL
ALBUMIN: 3.6 g/dL (ref 3.5–5.0)
ALKALINE PHOSPHATASE: 55 U/L (ref 40–110)
ALT (SGPT): 15 U/L (ref 8–22)
AST (SGOT): 18 U/L (ref 8–45)
BILIRUBIN DIRECT: 0.2 mg/dL (ref 0.1–0.4)
BILIRUBIN TOTAL: 0.3 mg/dL (ref 0.3–1.3)
PROTEIN TOTAL: 7 g/dL (ref 6.4–8.3)

## 2023-05-05 LAB — CREATININE WITH EGFR
CREATININE: 0.83 mg/dL (ref 0.60–1.05)
ESTIMATED GFR - FEMALE: >90 mL/min/BSA (ref >=60)

## 2023-05-05 LAB — SEDIMENTATION RATE: ERYTHROCYTE SEDIMENTATION RATE (ESR): 2 mm/h (ref 0–20)

## 2023-05-05 LAB — C-REACTIVE PROTEIN(CRP),INFLAMMATION: CRP INFLAMMATION: 5.3 mg/L (ref <8.0)

## 2023-05-07 ENCOUNTER — Other Ambulatory Visit: Payer: Self-pay

## 2023-05-07 ENCOUNTER — Ambulatory Visit: Payer: BC Managed Care – PPO | Attending: RHEUMATOLOGY | Admitting: RHEUMATOLOGY

## 2023-05-07 ENCOUNTER — Encounter (INDEPENDENT_AMBULATORY_CARE_PROVIDER_SITE_OTHER): Payer: Self-pay | Admitting: RHEUMATOLOGY

## 2023-05-07 VITALS — BP 134/98 | HR 78 | Temp 97.6°F | Resp 16 | Ht 66.0 in | Wt 188.0 lb

## 2023-05-07 DIAGNOSIS — M0579 Rheumatoid arthritis with rheumatoid factor of multiple sites without organ or systems involvement: Secondary | ICD-10-CM | POA: Insufficient documentation

## 2023-05-07 DIAGNOSIS — Z79899 Other long term (current) drug therapy: Secondary | ICD-10-CM | POA: Insufficient documentation

## 2023-05-07 DIAGNOSIS — R768 Other specified abnormal immunological findings in serum: Secondary | ICD-10-CM | POA: Insufficient documentation

## 2023-05-07 MED ORDER — FOLIC ACID 1 MG TABLET
1.0000 mg | ORAL_TABLET | Freq: Every day | ORAL | 0 refills | Status: AC
Start: 2023-05-07 — End: 2023-08-05

## 2023-05-07 MED ORDER — METHOTREXATE SODIUM 2.5 MG TABLET
15.0000 mg | ORAL_TABLET | ORAL | 0 refills | Status: DC
Start: 2023-05-07 — End: 2023-05-17
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
## 2023-05-07 NOTE — Nursing Note (Signed)
BP (!) 134/98   Pulse 78   Temp 36.4 C (97.6 F) (Thermal Scan)   Resp 16   Ht 1.676 m (5\' 6" )   Wt 85.3 kg (188 lb)   SpO2 98%   BMI 30.34 kg/m   Kriste Basque, LPN

## 2023-05-16 ENCOUNTER — Encounter (INDEPENDENT_AMBULATORY_CARE_PROVIDER_SITE_OTHER): Payer: Self-pay | Admitting: RHEUMATOLOGY

## 2023-05-17 ENCOUNTER — Other Ambulatory Visit (INDEPENDENT_AMBULATORY_CARE_PROVIDER_SITE_OTHER): Payer: Self-pay | Admitting: RHEUMATOLOGY

## 2023-05-17 DIAGNOSIS — Z79899 Other long term (current) drug therapy: Secondary | ICD-10-CM

## 2023-05-17 DIAGNOSIS — M0579 Rheumatoid arthritis with rheumatoid factor of multiple sites without organ or systems involvement: Secondary | ICD-10-CM

## 2023-05-17 MED ORDER — METHOTREXATE SODIUM 2.5 MG TABLET
17.5000 mg | ORAL_TABLET | ORAL | 0 refills | Status: AC
Start: 2023-05-17 — End: 2023-08-15

## 2023-08-02 ENCOUNTER — Other Ambulatory Visit (INDEPENDENT_AMBULATORY_CARE_PROVIDER_SITE_OTHER): Payer: Self-pay | Admitting: RHEUMATOLOGY

## 2023-08-02 DIAGNOSIS — Z79899 Other long term (current) drug therapy: Secondary | ICD-10-CM

## 2023-08-02 DIAGNOSIS — M0579 Rheumatoid arthritis with rheumatoid factor of multiple sites without organ or systems involvement: Secondary | ICD-10-CM

## 2023-08-09 ENCOUNTER — Ambulatory Visit (INDEPENDENT_AMBULATORY_CARE_PROVIDER_SITE_OTHER): Payer: Self-pay | Admitting: RHEUMATOLOGY

## 2023-08-18 ENCOUNTER — Encounter (INDEPENDENT_AMBULATORY_CARE_PROVIDER_SITE_OTHER): Payer: Self-pay

## 2023-08-18 ENCOUNTER — Ambulatory Visit: Payer: BC Managed Care – PPO | Attending: RHEUMATOLOGY

## 2023-08-18 ENCOUNTER — Other Ambulatory Visit: Payer: Self-pay

## 2023-08-18 DIAGNOSIS — Z79899 Other long term (current) drug therapy: Secondary | ICD-10-CM | POA: Insufficient documentation

## 2023-08-18 DIAGNOSIS — M0579 Rheumatoid arthritis with rheumatoid factor of multiple sites without organ or systems involvement: Secondary | ICD-10-CM | POA: Insufficient documentation

## 2023-08-18 LAB — HEPATIC FUNCTION PANEL
ALBUMIN: 3.4 g/dL — ABNORMAL LOW (ref 3.5–5.0)
ALKALINE PHOSPHATASE: 52 U/L (ref 40–110)
ALT (SGPT): 11 U/L (ref <31)
AST (SGOT): 11 U/L (ref 11–34)
BILIRUBIN DIRECT: 0.2 mg/dL (ref 0.1–0.4)
BILIRUBIN TOTAL: 0.5 mg/dL (ref 0.3–1.3)
PROTEIN TOTAL: 6.9 g/dL (ref 6.0–7.9)

## 2023-08-18 LAB — CBC WITH DIFF
BASOPHIL #: 0.12 10*3/uL (ref <=0.20)
BASOPHIL %: 1.2 %
EOSINOPHIL #: 0.54 10*3/uL — ABNORMAL HIGH (ref <=0.50)
EOSINOPHIL %: 5.3 %
HCT: 41.5 % (ref 34.8–46.0)
HGB: 14.1 g/dL (ref 11.5–16.0)
IMMATURE GRANULOCYTE #: <0.1 10*3/uL (ref <0.10)
IMMATURE GRANULOCYTE %: 0.4 % (ref 0.0–1.0)
LYMPHOCYTE #: 2.62 10*3/uL (ref 1.00–4.80)
LYMPHOCYTE %: 25.6 %
MCH: 29.4 pg (ref 26.0–32.0)
MCHC: 34 g/dL (ref 31.0–35.5)
MCV: 86.5 fL (ref 78.0–100.0)
MONOCYTE #: 0.66 10*3/uL (ref 0.20–1.10)
MONOCYTE %: 6.5 %
MPV: 9.7 fL (ref 8.7–12.5)
NEUTROPHIL #: 6.24 10*3/uL (ref 1.50–7.70)
NEUTROPHIL %: 61 %
PLATELETS: 398 10*3/uL (ref 150–400)
RBC: 4.8 10*6/uL (ref 3.85–5.22)
RDW-CV: 13.2 % (ref 11.5–15.5)
WBC: 10.2 10*3/uL (ref 3.7–11.0)

## 2023-08-18 LAB — CREATININE WITH EGFR
CREATININE: 0.82 mg/dL (ref 0.60–1.05)
ESTIMATED GFR - FEMALE: >90 mL/min/BSA (ref >=60)

## 2023-08-18 LAB — C-REACTIVE PROTEIN(CRP),INFLAMMATION: CRP INFLAMMATION: 11.7 mg/L — ABNORMAL HIGH (ref <8.0)

## 2023-08-18 LAB — SEDIMENTATION RATE: ERYTHROCYTE SEDIMENTATION RATE (ESR): 11 mm/h (ref 0–20)

## 2023-08-19 ENCOUNTER — Ambulatory Visit: Payer: BC Managed Care – PPO | Attending: RHEUMATOLOGY | Admitting: RHEUMATOLOGY

## 2023-08-19 DIAGNOSIS — Z79899 Other long term (current) drug therapy: Secondary | ICD-10-CM

## 2023-08-19 DIAGNOSIS — R768 Other specified abnormal immunological findings in serum: Secondary | ICD-10-CM

## 2023-08-19 DIAGNOSIS — M0579 Rheumatoid arthritis with rheumatoid factor of multiple sites without organ or systems involvement: Secondary | ICD-10-CM

## 2023-08-19 MED ORDER — METHOTREXATE SODIUM 2.5 MG TABLET
17.5000 mg | ORAL_TABLET | ORAL | 0 refills | Status: AC
Start: 2023-08-19 — End: 2023-11-17

## 2023-08-19 MED ORDER — FOLIC ACID 1 MG TABLET
1.0000 mg | ORAL_TABLET | Freq: Every day | ORAL | 0 refills | Status: AC
Start: 2023-08-19 — End: 2023-11-17
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
## 2023-09-27 ENCOUNTER — Other Ambulatory Visit: Payer: Self-pay

## 2023-09-27 ENCOUNTER — Ambulatory Visit (HOSPITAL_COMMUNITY): Payer: Self-pay

## 2023-09-27 ENCOUNTER — Ambulatory Visit: Attending: FAMILY MEDICINE | Admitting: FAMILY MEDICINE

## 2023-09-27 ENCOUNTER — Encounter (INDEPENDENT_AMBULATORY_CARE_PROVIDER_SITE_OTHER): Payer: Self-pay | Admitting: FAMILY MEDICINE

## 2023-09-27 VITALS — BP 140/75 | HR 109 | Temp 98.5°F | Resp 18 | Ht 66.0 in | Wt 188.0 lb

## 2023-09-27 DIAGNOSIS — M79672 Pain in left foot: Secondary | ICD-10-CM | POA: Insufficient documentation

## 2023-09-27 DIAGNOSIS — M5416 Radiculopathy, lumbar region: Secondary | ICD-10-CM | POA: Insufficient documentation

## 2023-09-27 DIAGNOSIS — M5440 Lumbago with sciatica, unspecified side: Secondary | ICD-10-CM | POA: Insufficient documentation

## 2023-09-27 MED ORDER — IBUPROFEN 600 MG TABLET
600.0000 mg | ORAL_TABLET | Freq: Three times a day (TID) | ORAL | 0 refills | Status: DC
Start: 2023-09-27 — End: 2023-10-07

## 2023-09-27 MED ORDER — PREDNISONE 20 MG TABLET
40.0000 mg | ORAL_TABLET | Freq: Every day | ORAL | 0 refills | Status: DC
Start: 2023-09-27 — End: 2023-10-07

## 2023-09-27 NOTE — Telephone Encounter (Signed)
 Patient called stating that she has been in miserable pain since Wednesday night and it is not going away. She spoke to her mom and her mom suspects that she has plantar fascitis. She has pain in her foot and it s going to her back. She states her back is stiff and the bottom of her foot is on fire and every time she puts pressure on her foot, it feels like she is walking on needles. She has tried ice, heat, rest, OTC tylenol and ibuprofen and nothing is relieving her pain. She rates her pain a 10 when walking and a 5 when she is sitting down. She denies any skin discoloration, swelling, or any other symptoms. Patient scheduled for a same day sick appt with Dr.Clayborne.  Estelle June, RN  [facility]  09/27/2023, 11:15    Reason for Disposition   [1] MODERATE pain (e.g., interferes with normal activities, limping) AND [2] present > 3 days    Additional Information   Negative: Followed a foot injury   Negative: Diabetes   Negative: Ankle pain is main symptom   Negative: Thigh or calf pain is main symptom   Negative: Entire foot is cool or blue in comparison to other foot   Negative: Purple or black skin on foot or toe   Negative: [1] Red area or streak AND [2] fever   Negative: [1] Swollen foot AND [2] fever   Negative: Patient sounds very sick or weak to the triager   Negative: [1] SEVERE pain (e.g., excruciating, unable to do any normal activities) AND [2] not improved after 2 hours of pain medicine   Negative: [1] Looks infected (spreading redness, pus) AND [2] large red area (> 2 in. or 5 cm)   Negative: Looks like a boil, infected sore, or deep ulcer   Negative: [1] Redness of the skin AND [2] no fever   Negative: [1] Swollen foot AND [2] no fever  (Exceptions: localized bump from bunions, calluses, insect bite, sting)   Negative: Numbness in one foot (i.e., loss of sensation)    Protocols used: Foot Pain-ADULT-AH
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
## 2023-09-27 NOTE — Progress Notes (Cosign Needed)
 FAMILY MEDICINE - ESTABLISHED PATIENT    Patient ID: [name] [name]  MRN: [id number]  Date: 09/27/2023     ASSESSMENT AND PLAN   [name] [name] is a 33 y.o. patient who presented to the office today for chief complaint of Foot Pain        Lumbar radiculopathy    ~5 days ago she reports pain in her lower back started. She notes a  hx of sciatica and  istaking methotrexate or tylenol/Advil, daily/ as needed. Her last back imaging showed inflammation/arthritis in May 2024. Pain is primarily in the lower back and radiates into the left leg. Back pain started after her last childbirth ~12 yrs ago and has bothered her intermittently since.     - Rx ibuprofen 600 mg and prednisone 20 mg   - Ordered MRI   - Discussed conservative home treatment options   - Continue to monitor          Left foot pain   ~6 days ago she reports noticing shooting pain in the foot after walking stairs. A few days later she notes a tingling in her foot radiating into her toes from her heel. Dx with plantar fascitis .When putting pressure on the heel, she reports "it feels like I'm walking on hot needles" in the toes while walking.     - Discussed possible tendonitis vs radiculopathy   - Rx prednisone 20 mg and ibuprofen 600 mg   - Discussed conservative home treatment options   - Ordered LT foot XR   - Continue to monitor          Health Maintenance:  Health Maintenance Due   Topic Date Due    HIV Screening  Never done    Hepatitis B Vaccine (1 of 3 - 19+ 3-dose series) Never done    Shingles Vaccine (1 of 2) 11/15/2008    Pap smear/HPV  05/08/2012    Pneumococcal Vaccine, Age 25-49 (2 of 2 - PCV) 02/27/2019    Covid-19 Vaccine (3 - Pfizer risk series) 12/09/2019    Adult Tdap-Td (2 - Td or Tdap) 02/19/2022    Influenza Vaccine (1) 03/14/2023         SUBJECTIVE   [name] [name] is a 33 y.o. female seen on 09/27/2023 for Foot Pain    Patient seen unaccompanied.     1 week ago she rpeorts developing lower back pain and tingling/burning while walking in the  left foot/ toes. She inquires if these pains are related and would like to discuss relief options.     Review of systems as in subjective    MEDICATIONS  Current Outpatient Medications   Medication Instructions    BIOTIN ORAL Take by mouth    chlorhexidine gluconate (PERIDEX) 0.12 % Mucous Membrane Mouthwash     Drospirenone-Ethinyl Estradiol (equiv to: YASMIN) 3-0.03 mg Oral Tablet 1 Tablet, Oral, Daily    folic acid (FOLVITE) 1 mg, Oral, Daily    Ibuprofen (MOTRIN) 600 mg, Oral, 3 TIMES DAILY WITH MEALS    methotrexate 17.5 mg, Oral, EVERY 7 DAYS    multivit with calcium,iron,min [facility] MULTIPLE VITAMINS ORAL) Take by mouth    predniSONE (DELTASONE) 40 mg, Oral, Daily       OBJECTIVE   Vitals: BP (!) 140/75   Pulse (!) 109   Temp 36.9 C (98.5 F) (Oral)   Resp 18   Ht 1.676 m (5\' 6" )   Wt 85.3 kg (188 lb)   SpO2 96%   BMI  30.34 kg/m       Bml: Body mass index is 30.34 kg/m.     Physical Exam  Vitals reviewed.   Constitutional:       Appearance: Normal appearance.   HENT:      Head: Normocephalic and atraumatic.      Right Ear: External ear normal.      Left Ear: External ear normal.      Nose: Nose normal.      Mouth/Throat:      Mouth: Mucous membranes are moist.      Pharynx: Oropharynx is clear.   Eyes:      Extraocular Movements: Extraocular movements intact.      Conjunctiva/sclera: Conjunctivae normal.   Pulmonary:      Effort: Pulmonary effort is normal.   Skin:     General: Skin is warm and dry.   Neurological:      General: No focal deficit present.      Mental Status: She is alert and oriented to person, place, and time.         I am scribing for, and in the presence of, Dr. Thurnell Garbe for services provided on 09/27/2023.  Daryl Eastern, SCRIBE   Daryl Eastern, SCRIBE      ASSESSMENT AND PLAN AT TOP OF NOTE

## 2023-09-27 NOTE — Assessment & Plan Note (Signed)
~  5 days ago she reports pain in her lower back started. She notes a  hx of sciatica and  istaking methotrexate or tylenol/Advil, daily/ as needed. Her last back imaging showed inflammation/arthritis in May 2024. Pain is primarily in the lower back and radiates into the left leg. Back pain started after her last childbirth ~12 yrs ago and has bothered her intermittently since.     - Rx ibuprofen 600 mg and prednisone 20 mg   - Ordered MRI   - Discussed conservative home treatment options   - Continue to monitor

## 2023-09-27 NOTE — Assessment & Plan Note (Signed)
~  6 days ago she reports noticing shooting pain in the foot after walking stairs. A few days later she notes a tingling in her foot radiating into her toes from her heel. Dx with plantar fascitis .When putting pressure on the heel, she reports "it feels like I'm walking on hot needles" in the toes while walking.     - Discussed possible tendonitis vs radiculopathy   - Rx prednisone 20 mg and ibuprofen 600 mg   - Discussed conservative home treatment options   - Ordered LT foot XR   - Continue to monitor

## 2023-09-28 ENCOUNTER — Encounter (HOSPITAL_COMMUNITY): Payer: Self-pay

## 2023-10-05 ENCOUNTER — Encounter (INDEPENDENT_AMBULATORY_CARE_PROVIDER_SITE_OTHER): Payer: Self-pay | Admitting: FAMILY MEDICINE

## 2023-10-07 ENCOUNTER — Other Ambulatory Visit (INDEPENDENT_AMBULATORY_CARE_PROVIDER_SITE_OTHER): Payer: Self-pay | Admitting: FAMILY MEDICINE

## 2023-10-07 DIAGNOSIS — M5416 Radiculopathy, lumbar region: Secondary | ICD-10-CM

## 2023-10-07 DIAGNOSIS — M5440 Lumbago with sciatica, unspecified side: Secondary | ICD-10-CM

## 2023-10-07 MED ORDER — IBUPROFEN 600 MG TABLET
600.0000 mg | ORAL_TABLET | Freq: Three times a day (TID) | ORAL | 0 refills | Status: AC
Start: 2023-10-07 — End: 2023-10-17

## 2023-10-07 MED ORDER — PREDNISONE 20 MG TABLET
ORAL_TABLET | ORAL | 0 refills | Status: AC
Start: 2023-10-07 — End: 2023-10-18

## 2023-10-09 ENCOUNTER — Encounter (INDEPENDENT_AMBULATORY_CARE_PROVIDER_SITE_OTHER): Payer: Self-pay | Admitting: FAMILY MEDICINE

## 2023-10-11 NOTE — Telephone Encounter (Signed)
 LOV  09/27/23

## 2023-10-12 MED ORDER — DROSPIRENONE 3 MG-ETHINYL ESTRADIOL 0.03 MG TABLET
1.0000 | ORAL_TABLET | Freq: Every day | ORAL | 3 refills | Status: DC
Start: 2023-10-12 — End: 2024-01-31

## 2023-10-15 ENCOUNTER — Other Ambulatory Visit: Payer: Self-pay

## 2023-10-15 ENCOUNTER — Ambulatory Visit
Admission: RE | Admit: 2023-10-15 | Discharge: 2023-10-15 | Disposition: A | Discharge status: Home / Self Care | Source: Ambulatory Visit | Attending: FAMILY MEDICINE | Admitting: FAMILY MEDICINE

## 2023-10-15 DIAGNOSIS — M5116 Intervertebral disc disorders with radiculopathy, lumbar region: Secondary | ICD-10-CM

## 2023-10-15 DIAGNOSIS — M5440 Lumbago with sciatica, unspecified side: Secondary | ICD-10-CM | POA: Insufficient documentation

## 2023-10-15 DIAGNOSIS — M5416 Radiculopathy, lumbar region: Secondary | ICD-10-CM | POA: Insufficient documentation

## 2023-12-17 ENCOUNTER — Ambulatory Visit (INDEPENDENT_AMBULATORY_CARE_PROVIDER_SITE_OTHER): Payer: Self-pay | Admitting: RHEUMATOLOGY

## 2024-01-03 ENCOUNTER — Ambulatory Visit: Attending: RHEUMATOLOGY

## 2024-01-03 ENCOUNTER — Other Ambulatory Visit: Payer: Self-pay

## 2024-01-03 DIAGNOSIS — R768 Other specified abnormal immunological findings in serum: Secondary | ICD-10-CM | POA: Insufficient documentation

## 2024-01-03 DIAGNOSIS — M0579 Rheumatoid arthritis with rheumatoid factor of multiple sites without organ or systems involvement: Secondary | ICD-10-CM | POA: Insufficient documentation

## 2024-01-03 DIAGNOSIS — Z79899 Other long term (current) drug therapy: Secondary | ICD-10-CM | POA: Insufficient documentation

## 2024-01-03 LAB — CREATININE WITH EGFR
CREATININE: 0.78 mg/dL (ref 0.60–1.05)
ESTIMATED GFR - FEMALE: >90 mL/min/BSA (ref >=60)

## 2024-01-03 LAB — CBC WITH DIFF
BASOPHIL #: 0.13 10*3/uL (ref <=0.20)
BASOPHIL %: 1.3 %
EOSINOPHIL #: 0.51 10*3/uL — ABNORMAL HIGH (ref <=0.50)
EOSINOPHIL %: 5.1 %
HCT: 41.3 % (ref 34.8–46.0)
HGB: 14.3 g/dL (ref 11.5–16.0)
IMMATURE GRANULOCYTE #: <0.1 10*3/uL (ref <0.10)
IMMATURE GRANULOCYTE %: 0.2 % (ref 0.0–1.0)
LYMPHOCYTE #: 2.42 10*3/uL (ref 1.00–4.80)
LYMPHOCYTE %: 24.1 %
MCH: 29.6 pg (ref 26.0–32.0)
MCHC: 34.6 g/dL (ref 31.0–35.5)
MCV: 85.5 fL (ref 78.0–100.0)
MONOCYTE #: 0.55 10*3/uL (ref 0.20–1.10)
MONOCYTE %: 5.5 %
MPV: 10.1 fL (ref 8.7–12.5)
NEUTROPHIL #: 6.4 10*3/uL (ref 1.50–7.70)
NEUTROPHIL %: 63.8 %
PLATELETS: 404 10*3/uL — ABNORMAL HIGH (ref 150–400)
RBC: 4.83 10*6/uL (ref 3.85–5.22)
RDW-CV: 12.6 % (ref 11.5–15.5)
WBC: 10 10*3/uL (ref 3.7–11.0)

## 2024-01-03 LAB — HEPATIC FUNCTION PANEL
ALBUMIN: 3.3 g/dL — ABNORMAL LOW (ref 3.5–5.0)
ALKALINE PHOSPHATASE: 48 U/L (ref 40–110)
ALT (SGPT): 15 U/L (ref <31)
AST (SGOT): 19 U/L (ref 11–34)
BILIRUBIN DIRECT: 0.2 mg/dL (ref 0.1–0.4)
BILIRUBIN TOTAL: 0.6 mg/dL (ref 0.3–1.3)
PROTEIN TOTAL: 6.5 g/dL (ref 6.0–7.9)

## 2024-01-03 LAB — SEDIMENTATION RATE: ERYTHROCYTE SEDIMENTATION RATE (ESR): 3 mm/h (ref 0–20)

## 2024-01-03 LAB — C-REACTIVE PROTEIN(CRP),INFLAMMATION: CRP INFLAMMATION: 10.5 mg/L — ABNORMAL HIGH (ref <8.0)

## 2024-01-06 ENCOUNTER — Other Ambulatory Visit: Payer: Self-pay

## 2024-01-06 ENCOUNTER — Ambulatory Visit: Payer: Self-pay | Attending: RHEUMATOLOGY | Admitting: RHEUMATOLOGY

## 2024-01-06 ENCOUNTER — Encounter (INDEPENDENT_AMBULATORY_CARE_PROVIDER_SITE_OTHER): Payer: Self-pay | Admitting: RHEUMATOLOGY

## 2024-01-06 VITALS — BP 136/83 | HR 115 | Resp 18 | Ht 66.0 in | Wt 189.6 lb

## 2024-01-06 DIAGNOSIS — M0579 Rheumatoid arthritis with rheumatoid factor of multiple sites without organ or systems involvement: Secondary | ICD-10-CM

## 2024-01-06 DIAGNOSIS — M51369 Other intervertebral disc degeneration, lumbar region without mention of lumbar back pain or lower extremity pain: Secondary | ICD-10-CM | POA: Insufficient documentation

## 2024-01-06 DIAGNOSIS — Z79899 Other long term (current) drug therapy: Secondary | ICD-10-CM | POA: Insufficient documentation

## 2024-01-06 MED ORDER — FOLIC ACID 1 MG TABLET
1.0000 mg | ORAL_TABLET | Freq: Every day | ORAL | 0 refills | Status: AC
Start: 2024-01-06 — End: 2024-04-05

## 2024-01-06 MED ORDER — METHOTREXATE SODIUM 2.5 MG TABLET
17.5000 mg | ORAL_TABLET | ORAL | 0 refills | Status: DC
Start: 2024-01-06 — End: 2024-05-22
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
## 2024-01-06 NOTE — Nursing Note (Signed)
 01/06/24 1018   Recent Weight Change   Have you had a recent unexplained weight loss or gain? N   Health Education and Literacy   How often do you have a problem understanding what is told to you about your medical condition?  Never   Domestic Violence   Because we are aware of abuse and domestic violence today, we ask all patients: Are you being hurt, hit, or frightened by anyone at your home or in your life?  N   Basic Needs   Do you have any basic needs within your home that are not being met? (such as Food, Shelter, Civil [employer] fast streamer, Tranportation, paying for bills and/or medications) N     Dewey Neukam, CMA   01/06/2024, 10:19

## 2024-01-06 NOTE — Nursing Note (Signed)
 BP 136/83 (Site: Left Arm, Patient Position: Sitting, Cuff Size: Adult)   Pulse (!) 115   Resp 18   Ht 1.676 m (5' 6)   Wt 86 kg (189 lb 9.6 oz)   SpO2 98%   BMI 30.60 kg/m     [facility], CMA   01/06/2024, 10:21

## 2024-01-31 ENCOUNTER — Other Ambulatory Visit: Payer: Self-pay | Admitting: Medical Genetics

## 2024-01-31 ENCOUNTER — Other Ambulatory Visit (INDEPENDENT_AMBULATORY_CARE_PROVIDER_SITE_OTHER): Payer: Self-pay | Admitting: FAMILY MEDICINE

## 2024-01-31 DIAGNOSIS — Z309 Encounter for contraceptive management, unspecified: Secondary | ICD-10-CM

## 2024-01-31 MED ORDER — DROSPIRENONE 3 MG-ETHINYL ESTRADIOL 0.03 MG TABLET
1.0000 | ORAL_TABLET | Freq: Every day | ORAL | 4 refills | Status: AC
Start: 2024-01-31 — End: ?

## 2024-01-31 NOTE — Telephone Encounter (Signed)
 Patient called in stating pharmacy will not fill script for birth control due to insurance coverage.     [place] from [facility] is requiring mail in pharmacy - [facility] is not preferred pharmacy.     Myla Ahle, RN

## 2024-01-31 NOTE — Telephone Encounter (Signed)
 Patient returned call, she spoke to her insurance and [address]. She can use [facility] - just needs script updated for 90 day fil. Because she skips the placebo week- she needs 4 packs a fill, not 3. Updated script for review.     Thank you!

## 2024-02-21 ENCOUNTER — Other Ambulatory Visit: Payer: Self-pay

## 2024-02-21 ENCOUNTER — Ambulatory Visit: Attending: FAMILY MEDICINE | Admitting: FAMILY MEDICINE

## 2024-02-21 ENCOUNTER — Encounter (INDEPENDENT_AMBULATORY_CARE_PROVIDER_SITE_OTHER): Payer: Self-pay | Admitting: FAMILY MEDICINE

## 2024-02-21 VITALS — BP 134/95 | HR 96 | Temp 98.7°F | Resp 18 | Ht 66.0 in | Wt 190.0 lb

## 2024-02-21 DIAGNOSIS — Z0000 Encounter for general adult medical examination without abnormal findings: Secondary | ICD-10-CM | POA: Insufficient documentation

## 2024-02-21 DIAGNOSIS — M069 Rheumatoid arthritis, unspecified: Secondary | ICD-10-CM | POA: Insufficient documentation

## 2024-02-21 DIAGNOSIS — M5440 Lumbago with sciatica, unspecified side: Secondary | ICD-10-CM | POA: Insufficient documentation

## 2024-02-21 DIAGNOSIS — Z131 Encounter for screening for diabetes mellitus: Secondary | ICD-10-CM | POA: Insufficient documentation

## 2024-02-21 DIAGNOSIS — Z13220 Encounter for screening for lipoid disorders: Secondary | ICD-10-CM | POA: Insufficient documentation

## 2024-02-21 DIAGNOSIS — N926 Irregular menstruation, unspecified: Secondary | ICD-10-CM | POA: Insufficient documentation

## 2024-02-21 NOTE — Nursing Note (Signed)
 02/21/24 0909   Depression Screen   Little interest or pleasure in doing things. 0   Feeling down, depressed, or hopeless 0   PHQ 2 Total 0

## 2024-02-21 NOTE — Progress Notes (Signed)
 FAMILY MEDICINE, MEDICAL OFFICE BUILDING  [address]  [place] [address]  Operated by [facility]     Name: [name] [name] MRN:  [id number]   Date: 02/21/2024 Age: 33 y.o.     WELL WOMAN VISIT      [name] [name] is a 33 y.o. who presents for her annual exam.    Today: pt presents for an annual exam and to discuss her chronic pain..      Continues to have lower back pain with sciatica. She notices her back is very stiff. She has been having more hip pain (anterior, deep in joint) with activity - even just walking around shopping with her daughters. She also notes bilateral foot pain on the lateral and medial edges, left foot worse than right. There has been no bruising or swelling associated.       Menstrual History  Last menstrual period:  No LMP recorded. -- started 5 days ago.   Are periods regular: no   Any problems with periods: cramping (especially in lower back), some clots.   Current contraception: OCPs    - issues with Nexplanon (rejection)   - weight gain with depo provera   Menopausal symptoms: no  [id number]      Breast Health Screening  Breast concerns or complaints?  no      Genitourinary Screening  Pelvic pressure or fullness?  no  Urinary complaints or concerns?  no  Pelvic prolapse symptoms?  no  Fecal incontinence or concerns?  no  Dyspareunia or sexual concerns?  no   Low libido - back and hip pain have been contributing.      Depression Screening (PHQ-2)    Little interest or pleasure in doing things.: Not at all  Feeling down, depressed, or hopeless: Not at all  PHQ 2 Total: 0        PMH:   Patient Active Problem List    Diagnosis    Left foot pain    Lumbar radiculopathy    Rheumatoid arthritis    Lupus    Encounter for contraceptive management    Tobacco use    Obesity    Fatigue    Hx MRSA infection    ASCUS on Pap smear for follow-up postpartum    Chlamydia infection, current pregnancy, treated        MEDICATIONS:  Current Outpatient Medications   Medication Sig    BIOTIN ORAL Take  by mouth (Patient not taking: Reported on 01/06/2024)    chlorhexidine gluconate (PERIDEX) 0.12 % Mucous Membrane Mouthwash  (Patient not taking: Reported on 01/06/2024)    Drospirenone -Ethinyl Estradiol  (equiv to: YASMIN ) 3-0.03 mg Oral Tablet Take 1 Tablet by mouth Daily Patient is to skip the placebo pills. Needs 4 packs for 90 day script.    folic acid  (FOLVITE ) 1 mg Oral Tablet Take 1 Tablet (1 mg total) by mouth Daily for 90 days    methotrexate  2.5 mg Oral tablet Take 7 Tablets (17.5 mg total) by mouth Every 7 days for 90 days    multivit with calcium,iron,min [facility] MULTIPLE VITAMINS ORAL) Take by mouth        ALLERGIES:  Gabapentin , Imitrex  [sumatriptan  succinate], and Other       Health Screenings:  Last pap: With [facility] about 4 years ago.    Any abnormal paps: no  Last mammogram: Never   Previous biopsy or surgery: no   Doing self breast exams: yes  Last colonoscopy: Never  Ever had Hep  C screening? yes    Immunizations up to date: no  Immunization History   Administered Date(s) Administered    Covid-19 Vaccine,Pfizer-BioNTech,Purple Top,97yrs+ 10/21/2019, 11/11/2019    DIPTH,PERTUSSIS-ACEL,TETANUS >10 YRS OLD 02/20/2012    INFLUENZA VIRUS VACCINE (ADMIN) 07/03/2009    VARICELLA VACCINE LIVE 02/23/2007       FH:  Family History   Adopted: Yes   Problem Relation Age of Onset    Cancer Neg Hx     Diabetes Neg Hx          SH:  Sexuality  The patient is sexually active with 1 partner(s).  Her partner(s) is female  Current contraception:  OCPs    Diet/Exercise:  Diet: Includes fruits and vegetables. Eats a variety of meats. Processed foods tries to minimize because it causes GI distress.   Exercise: None - limited by pain    Substance use:  Social History[1]      Safety:  Domestic violence: no      REVIEW OF SYSTEMS:  See HPI    Physical Examination:  BP (!) 148/98   Pulse 96   Temp 37.1 C (98.7 F) (Oral)   Resp 18   Ht 1.676 m (5' 6)   Wt 86.2 kg (190 lb)   SpO2 97%   BMI 30.67 kg/m      Physical Exam  Vitals reviewed.   Constitutional:       General: She is not in acute distress.     Appearance: Normal appearance. She is not ill-appearing.   HENT:      Head: Normocephalic and atraumatic.      Right Ear: External ear normal.      Left Ear: External ear normal.      Nose: Nose normal.   Eyes:      Extraocular Movements: Extraocular movements intact.      Conjunctiva/sclera: Conjunctivae normal.   Cardiovascular:      Rate and Rhythm: Normal rate and regular rhythm.      Pulses: Normal pulses.      Heart sounds: Normal heart sounds. No murmur heard.  Pulmonary:      Effort: Pulmonary effort is normal.      Breath sounds: Normal breath sounds. No wheezing.   Musculoskeletal:      Cervical back: Normal range of motion and neck supple.      Right lower leg: No edema.      Left lower leg: No edema.      Comments: No midline spinal tenderness or deformity noted. Tender to palpation bilateral SI joints noted.    Skin:     General: Skin is warm and dry.   Neurological:      General: No focal deficit present.      Mental Status: She is alert and oriented to person, place, and time.   Psychiatric:         Mood and Affect: Mood normal.         Behavior: Behavior normal.           ASSESSMENT / PLAN:    33 y.o. female here for well woman exam:    Completed/Discussed today:   *Immunizations (Tdap, flu, Pneumovax, Prevnar, Shingrix) - will return for nurses visit for pneumococcal, Tdap and shingles vaccinations  *Diet/exercise improvements/goals discussed.  *Tobacco/Alcohol/Drug use - No concerns   *Depression screen - Negative  *Contraception - OCPs   Will continue ocps at this time, but consider switching in about 3 months as she has 3  packs of OCPs left.     *A1C screening - ordered today   *Lipid screening - ordered today      Not indicated:  Up to date:    *Pap smear - had completed in [facility].   Repeat next year.   *Hep C screening - negative 11/27/2022  *HIV screening - negative  05/09/2009        Other issues:      ICD-10-CM    1. Annual physical exam  Z00.00 LIPID PANEL     HGA1C (HEMOGLOBIN A1C WITH EST AVG GLUCOSE)      2. Irregular menses  N92.6 THYROID  STIMULATING HORMONE WITH FREE T4 REFLEX      3. Screening for lipid disorders  Z13.220 LIPID PANEL      4. Screening for diabetes mellitus  Z13.1 HGA1C (HEMOGLOBIN A1C WITH EST AVG GLUCOSE)        *Will reach out to rheumatology for evaluation of hip pain.       Calton Romans, MD   02/21/2024 09:59          [1]   Social History  Tobacco Use    Smoking status: Former     Current packs/day: 0.00     Average packs/day: 0.3 packs/day for 3.0 years (0.8 ttl pk-yrs)     Types: Cigarettes     Quit date: 2022     Years since quitting: 3.6    Smokeless tobacco: Never    Tobacco comments:     quit when she found out she was pregnant   Vaping Use    Vaping status: Every Day    Substances: Nicotine   Substance Use Topics    Alcohol use: Yes     Comment: occ    Drug use: No

## 2024-02-21 NOTE — Nursing Note (Signed)
 Chief Complaint:   Chief Complaint              Annual Exam           Functional Health Screen  Review Flowsheet  More data exists         01/06/2024   FUNCTIONAL HEALTH SCREENING   Have you had a recent unexplained weight loss or gain? N   How often do you have a problem understanding what is told to you about your medical condition?  Never   Because we are aware of abuse and domestic violence today, we ask all patients: Are you being hurt, hit, or frightened by anyone at your home or in your life?  N   Do you have any basic needs within your home that are not being met? (such as Food, Shelter, [employer] fast streamer, Tranportation, paying for bills and/or medications) N     BP (!) 148/98   Pulse 96   Temp 37.1 C (98.7 F) (Oral)   Resp 18   Ht 1.676 m (5' 6)   Wt 86.2 kg (190 lb)   SpO2 97%   BMI 30.67 kg/m       Tobacco Use History[1]  Patient Health Rating           Depression Screening  PHQ Questionnaire  Little interest or pleasure in doing things.: Not at all  Feeling down, depressed, or hopeless: Not at all  PHQ 2 Total: 0  Allergies:  Allergies[2]  Medication History  Reviewed for OTC medication and any new medications, provider will review medication history  Results through Enter/Edit  No results found for this or any previous visit (from the past 24 hours).  POCT Results  Care Team  Patient Care Team:  Clayborne, Cassandra, MD as PCP - General (FAMILY MEDICINE)  Immunizations - last 24 hours       None          Duwaine Sane, [place]  02/21/2024, 09:12       [1]   Social History  Tobacco Use   Smoking Status Former    Current packs/day: 0.00    Average packs/day: 0.3 packs/day for 3.0 years (0.8 ttl pk-yrs)    Types: Cigarettes    Quit date: 2022    Years since quitting: 3.6   Smokeless Tobacco Never   Tobacco Comments    quit when she found out she was pregnant   [2]   Allergies  Allergen Reactions    Gabapentin  Rash and Swelling    Imitrex  [Sumatriptan  Succinate] Rash    Other Rash     Bleach

## 2024-02-24 ENCOUNTER — Ambulatory Visit: Payer: BC Managed Care – PPO | Admitting: FAMILY MEDICINE

## 2024-04-07 ENCOUNTER — Ambulatory Visit (INDEPENDENT_AMBULATORY_CARE_PROVIDER_SITE_OTHER): Payer: Self-pay | Admitting: RHEUMATOLOGY

## 2024-05-03 ENCOUNTER — Other Ambulatory Visit: Payer: Self-pay | Admitting: Medical Genetics

## 2024-05-03 DIAGNOSIS — Z006 Encounter for examination for normal comparison and control in clinical research program: Secondary | ICD-10-CM

## 2024-05-19 ENCOUNTER — Encounter (INDEPENDENT_AMBULATORY_CARE_PROVIDER_SITE_OTHER): Payer: Self-pay

## 2024-05-19 ENCOUNTER — Other Ambulatory Visit (INDEPENDENT_AMBULATORY_CARE_PROVIDER_SITE_OTHER): Payer: Self-pay | Admitting: RHEUMATOLOGY

## 2024-05-19 DIAGNOSIS — M0579 Rheumatoid arthritis with rheumatoid factor of multiple sites without organ or systems involvement: Secondary | ICD-10-CM

## 2024-05-19 DIAGNOSIS — Z79899 Other long term (current) drug therapy: Secondary | ICD-10-CM

## 2024-05-22 ENCOUNTER — Other Ambulatory Visit: Payer: Self-pay

## 2024-05-22 ENCOUNTER — Ambulatory Visit: Attending: RHEUMATOLOGY

## 2024-05-22 DIAGNOSIS — N926 Irregular menstruation, unspecified: Secondary | ICD-10-CM | POA: Insufficient documentation

## 2024-05-22 DIAGNOSIS — Z13220 Encounter for screening for lipoid disorders: Secondary | ICD-10-CM | POA: Insufficient documentation

## 2024-05-22 DIAGNOSIS — Z0000 Encounter for general adult medical examination without abnormal findings: Secondary | ICD-10-CM | POA: Insufficient documentation

## 2024-05-22 DIAGNOSIS — Z79899 Other long term (current) drug therapy: Secondary | ICD-10-CM | POA: Insufficient documentation

## 2024-05-22 DIAGNOSIS — M0579 Rheumatoid arthritis with rheumatoid factor of multiple sites without organ or systems involvement: Secondary | ICD-10-CM | POA: Insufficient documentation

## 2024-05-22 DIAGNOSIS — Z131 Encounter for screening for diabetes mellitus: Secondary | ICD-10-CM | POA: Insufficient documentation

## 2024-05-22 LAB — C-REACTIVE PROTEIN(CRP),INFLAMMATION: CRP INFLAMMATION: 6.4 mg/L (ref <8.0)

## 2024-05-22 LAB — CBC WITH DIFF
BASOPHIL #: 0.14 x10ˆ3/uL (ref <=0.20)
BASOPHIL %: 1.3 %
EOSINOPHIL #: 0.37 x10ˆ3/uL (ref <=0.50)
EOSINOPHIL %: 3.5 %
HCT: 41 % (ref 34.8–46.0)
HGB: 14 g/dL (ref 11.5–16.0)
IMMATURE GRANULOCYTE #: <0.1 x10ˆ3/uL (ref <0.10)
IMMATURE GRANULOCYTE %: 0.3 % (ref 0.0–1.0)
LYMPHOCYTE #: 2.48 x10ˆ3/uL (ref 1.00–4.80)
LYMPHOCYTE %: 23.7 %
MCH: 29.3 pg (ref 26.0–32.0)
MCHC: 34.1 g/dL (ref 31.0–35.5)
MCV: 85.8 fL (ref 78.0–100.0)
MONOCYTE #: 0.63 x10ˆ3/uL (ref 0.20–1.10)
MONOCYTE %: 6 %
MPV: 9.9 fL (ref 8.7–12.5)
NEUTROPHIL #: 6.81 x10ˆ3/uL (ref 1.50–7.70)
NEUTROPHIL %: 65.2 %
PLATELETS: 403 x10ˆ3/uL — ABNORMAL HIGH (ref 150–400)
RBC: 4.78 x10ˆ6/uL (ref 3.85–5.22)
RDW-CV: 13.1 % (ref 11.5–15.5)
WBC: 10.5 x10ˆ3/uL (ref 3.7–11.0)

## 2024-05-22 LAB — SEDIMENTATION RATE: ERYTHROCYTE SEDIMENTATION RATE (ESR): 9 mm/h (ref 0–20)

## 2024-05-22 LAB — LIPID PANEL
CHOL/HDL RATIO: 4.2
CHOLESTEROL: 216 mg/dL — ABNORMAL HIGH (ref 100–200)
HDL CHOL: 51 mg/dL (ref >=50)
LDL CALC: 121 mg/dL — ABNORMAL HIGH (ref <100)
NON-HDL: 165 mg/dL (ref <=190)
TRIGLYCERIDES: 254 mg/dL — ABNORMAL HIGH (ref <150)
VLDL CALC: 44 mg/dL — ABNORMAL HIGH (ref <30)

## 2024-05-22 LAB — HEPATIC FUNCTION PANEL
ALBUMIN: 3.5 g/dL (ref 3.5–5.0)
ALKALINE PHOSPHATASE: 43 U/L (ref 40–110)
ALT (SGPT): 14 U/L (ref <31)
AST (SGOT): 16 U/L (ref 11–34)
BILIRUBIN DIRECT: 0.1 mg/dL (ref 0.1–0.4)
BILIRUBIN TOTAL: 0.4 mg/dL (ref 0.3–1.3)
PROTEIN TOTAL: 6.9 g/dL (ref 6.0–7.9)

## 2024-05-22 LAB — THYROID STIMULATING HORMONE WITH FREE T4 REFLEX: TSH: 1.209 u[IU]/mL (ref 0.350–4.940)

## 2024-05-22 LAB — CREATININE WITH EGFR
CREATININE: 0.76 mg/dL (ref 0.60–1.05)
eGFRcr - FEMALE: >90 mL/min/1.73mˆ2 (ref >=60)

## 2024-05-23 LAB — HGA1C (HEMOGLOBIN A1C WITH EST AVG GLUCOSE)
ESTIMATED AVERAGE GLUCOSE: 94 mg/dL
HEMOGLOBIN A1C: 4.9 % (ref 4.0–5.6)

## 2024-05-25 ENCOUNTER — Ambulatory Visit (HOSPITAL_COMMUNITY): Payer: Self-pay | Admitting: Student in an Organized Health Care Education/Training Program

## 2024-06-27 ENCOUNTER — Other Ambulatory Visit: Payer: Self-pay

## 2024-06-27 ENCOUNTER — Ambulatory Visit: Attending: RHEUMATOLOGY | Admitting: RHEUMATOLOGY

## 2024-06-27 ENCOUNTER — Encounter (INDEPENDENT_AMBULATORY_CARE_PROVIDER_SITE_OTHER): Payer: Self-pay | Admitting: RHEUMATOLOGY

## 2024-06-27 VITALS — BP 128/91 | HR 121 | Resp 18 | Ht 66.0 in | Wt 188.4 lb

## 2024-06-27 DIAGNOSIS — M0579 Rheumatoid arthritis with rheumatoid factor of multiple sites without organ or systems involvement: Secondary | ICD-10-CM

## 2024-06-27 DIAGNOSIS — Z79899 Other long term (current) drug therapy: Secondary | ICD-10-CM

## 2024-06-27 DIAGNOSIS — Z87891 Personal history of nicotine dependence: Secondary | ICD-10-CM

## 2024-06-27 MED ORDER — FOLIC ACID 1 MG TABLET: 1.0000 mg | ORAL_TABLET | Freq: Every day | ORAL | 0 refills | Status: AC
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
## 2024-06-27 NOTE — Nursing Note (Signed)
 BP (!) 128/91   Pulse (!) 121   Resp 18   Ht 1.676 m (5' 6)   Wt 85.5 kg (188 lb 6.4 oz)   SpO2 99%   BMI 30.41 kg/m     [facility], CMA   06/27/2024, 10:11

## 2024-06-30 ENCOUNTER — Encounter (INDEPENDENT_AMBULATORY_CARE_PROVIDER_SITE_OTHER): Payer: Self-pay | Admitting: RHEUMATOLOGY

## 2024-08-12 ENCOUNTER — Other Ambulatory Visit (INDEPENDENT_AMBULATORY_CARE_PROVIDER_SITE_OTHER): Payer: Self-pay | Admitting: RHEUMATOLOGY

## 2024-08-12 DIAGNOSIS — Z79899 Other long term (current) drug therapy: Secondary | ICD-10-CM

## 2024-08-12 DIAGNOSIS — M0579 Rheumatoid arthritis with rheumatoid factor of multiple sites without organ or systems involvement: Secondary | ICD-10-CM

## 2024-08-14 MED ORDER — METHOTREXATE SODIUM 2.5 MG TABLET: 17.5000 mg | ORAL_TABLET | ORAL | 0 refills | Status: AC

## 2024-09-26 ENCOUNTER — Ambulatory Visit (INDEPENDENT_AMBULATORY_CARE_PROVIDER_SITE_OTHER): Payer: Self-pay | Admitting: RHEUMATOLOGY
